# Patient Record
Sex: Male | Born: 1965 | Race: White | Hispanic: No | Smoking: Current every day smoker
Health system: Southern US, Community
[De-identification: ages and names within clinical notes are randomized; demographics above are authoritative.]

## PROBLEM LIST (undated history)

## (undated) DIAGNOSIS — J45909 Unspecified asthma, uncomplicated: Secondary | ICD-10-CM

## (undated) DIAGNOSIS — J449 Chronic obstructive pulmonary disease, unspecified: Secondary | ICD-10-CM

## (undated) DIAGNOSIS — J40 Bronchitis, not specified as acute or chronic: Secondary | ICD-10-CM

## (undated) DIAGNOSIS — M549 Dorsalgia, unspecified: Secondary | ICD-10-CM

## (undated) DIAGNOSIS — C801 Malignant (primary) neoplasm, unspecified: Secondary | ICD-10-CM

## (undated) DIAGNOSIS — J439 Emphysema, unspecified: Secondary | ICD-10-CM

---

## 1997-10-07 ENCOUNTER — Emergency Department (HOSPITAL_COMMUNITY): Admission: EM | Admit: 1997-10-07 | Discharge: 1997-10-07 | Payer: Self-pay | Admitting: Emergency Medicine

## 1997-10-07 ENCOUNTER — Encounter: Payer: Self-pay | Admitting: Emergency Medicine

## 1999-12-15 ENCOUNTER — Emergency Department (HOSPITAL_COMMUNITY): Admission: EM | Admit: 1999-12-15 | Discharge: 1999-12-15 | Payer: Self-pay | Admitting: Emergency Medicine

## 2004-10-10 ENCOUNTER — Emergency Department (HOSPITAL_COMMUNITY): Admission: EM | Admit: 2004-10-10 | Discharge: 2004-10-10 | Payer: Self-pay | Admitting: Emergency Medicine

## 2006-04-18 ENCOUNTER — Emergency Department (HOSPITAL_COMMUNITY): Admission: EM | Admit: 2006-04-18 | Discharge: 2006-04-18 | Payer: Self-pay | Admitting: Emergency Medicine

## 2007-09-28 ENCOUNTER — Emergency Department (HOSPITAL_COMMUNITY): Admission: EM | Admit: 2007-09-28 | Discharge: 2007-09-28 | Payer: Self-pay | Admitting: Emergency Medicine

## 2007-09-30 ENCOUNTER — Emergency Department (HOSPITAL_COMMUNITY): Admission: EM | Admit: 2007-09-30 | Discharge: 2007-09-30 | Payer: Self-pay | Admitting: Emergency Medicine

## 2010-01-19 ENCOUNTER — Emergency Department (HOSPITAL_COMMUNITY)
Admission: EM | Admit: 2010-01-19 | Discharge: 2010-01-19 | Payer: Self-pay | Source: Home / Self Care | Admitting: Emergency Medicine

## 2010-05-04 ENCOUNTER — Emergency Department (HOSPITAL_COMMUNITY)
Admission: EM | Admit: 2010-05-04 | Discharge: 2010-05-04 | Disposition: A | Discharge status: Home / Self Care | Payer: Self-pay | Attending: Emergency Medicine | Admitting: Emergency Medicine

## 2010-05-04 DIAGNOSIS — R209 Unspecified disturbances of skin sensation: Secondary | ICD-10-CM | POA: Insufficient documentation

## 2010-05-04 DIAGNOSIS — M79609 Pain in unspecified limb: Secondary | ICD-10-CM | POA: Insufficient documentation

## 2010-05-04 DIAGNOSIS — IMO0002 Reserved for concepts with insufficient information to code with codable children: Secondary | ICD-10-CM | POA: Insufficient documentation

## 2010-05-04 DIAGNOSIS — R3915 Urgency of urination: Secondary | ICD-10-CM | POA: Insufficient documentation

## 2010-05-04 DIAGNOSIS — K921 Melena: Secondary | ICD-10-CM | POA: Insufficient documentation

## 2010-05-04 DIAGNOSIS — M25559 Pain in unspecified hip: Secondary | ICD-10-CM | POA: Insufficient documentation

## 2010-05-04 DIAGNOSIS — R350 Frequency of micturition: Secondary | ICD-10-CM | POA: Insufficient documentation

## 2010-05-04 DIAGNOSIS — K625 Hemorrhage of anus and rectum: Secondary | ICD-10-CM | POA: Insufficient documentation

## 2010-05-04 DIAGNOSIS — K5900 Constipation, unspecified: Secondary | ICD-10-CM | POA: Insufficient documentation

## 2010-05-04 DIAGNOSIS — J45909 Unspecified asthma, uncomplicated: Secondary | ICD-10-CM | POA: Insufficient documentation

## 2010-05-04 LAB — DIFFERENTIAL
Basophils Absolute: 0.1 10*3/uL (ref 0.0–0.1)
Basophils Relative: 1 % (ref 0–1)
Eosinophils Absolute: 0.2 10*3/uL (ref 0.0–0.7)
Eosinophils Relative: 3 % (ref 0–5)
Lymphocytes Relative: 34 % (ref 12–46)
Lymphs Abs: 2.7 10*3/uL (ref 0.7–4.0)
Monocytes Absolute: 0.6 10*3/uL (ref 0.1–1.0)
Monocytes Relative: 8 % (ref 3–12)
Neutro Abs: 4.3 10*3/uL (ref 1.7–7.7)
Neutrophils Relative %: 55 % (ref 43–77)

## 2010-05-04 LAB — COMPREHENSIVE METABOLIC PANEL WITH GFR
ALT: 21 U/L (ref 0–53)
Alkaline Phosphatase: 49 U/L (ref 39–117)
BUN: 9 mg/dL (ref 6–23)
CO2: 26 meq/L (ref 19–32)
Chloride: 105 meq/L (ref 96–112)
Glucose, Bld: 86 mg/dL (ref 70–99)
Potassium: 3.8 meq/L (ref 3.5–5.1)
Sodium: 137 meq/L (ref 135–145)
Total Bilirubin: 0.4 mg/dL (ref 0.3–1.2)
Total Protein: 6.3 g/dL (ref 6.0–8.3)

## 2010-05-04 LAB — URINALYSIS, ROUTINE W REFLEX MICROSCOPIC
Bilirubin Urine: NEGATIVE
Glucose, UA: NEGATIVE mg/dL
Hgb urine dipstick: NEGATIVE
Ketones, ur: NEGATIVE mg/dL
Nitrite: NEGATIVE
Protein, ur: NEGATIVE mg/dL
Specific Gravity, Urine: 1.01 (ref 1.005–1.030)
Urobilinogen, UA: 0.2 mg/dL (ref 0.0–1.0)
pH: 7.5 (ref 5.0–8.0)

## 2010-05-04 LAB — CBC
HCT: 40.2 % (ref 39.0–52.0)
Hemoglobin: 13.7 g/dL (ref 13.0–17.0)
MCH: 30.3 pg (ref 26.0–34.0)
MCHC: 34.1 g/dL (ref 30.0–36.0)
MCV: 88.9 fL (ref 78.0–100.0)
Platelets: 264 10*3/uL (ref 150–400)
RBC: 4.52 MIL/uL (ref 4.22–5.81)
RDW: 13.4 % (ref 11.5–15.5)
WBC: 7.8 10*3/uL (ref 4.0–10.5)

## 2010-05-04 LAB — OCCULT BLOOD, POC DEVICE: Fecal Occult Bld: NEGATIVE

## 2010-05-04 LAB — COMPREHENSIVE METABOLIC PANEL
AST: 19 U/L (ref 0–37)
Albumin: 3.8 g/dL (ref 3.5–5.2)
Calcium: 8.9 mg/dL (ref 8.4–10.5)
Creatinine, Ser: 0.85 mg/dL (ref 0.4–1.5)
GFR calc Af Amer: >60 mL/min (ref >60)
GFR calc non Af Amer: >60 mL/min (ref >60)

## 2010-05-04 LAB — URINE MICROSCOPIC-ADD ON

## 2010-05-05 LAB — URINE CULTURE
Colony Count: 2000
Culture  Setup Time: 201204302157

## 2010-10-05 LAB — CULTURE, ROUTINE-ABSCESS

## 2012-01-25 ENCOUNTER — Emergency Department (HOSPITAL_COMMUNITY)
Admission: EM | Admit: 2012-01-25 | Discharge: 2012-01-25 | Disposition: A | Discharge status: Home / Self Care | Payer: Self-pay | Attending: Emergency Medicine | Admitting: Emergency Medicine

## 2012-01-25 ENCOUNTER — Encounter (HOSPITAL_COMMUNITY): Payer: Self-pay | Admitting: Emergency Medicine

## 2012-01-25 ENCOUNTER — Emergency Department (HOSPITAL_COMMUNITY): Payer: Self-pay

## 2012-01-25 DIAGNOSIS — M545 Low back pain, unspecified: Secondary | ICD-10-CM | POA: Insufficient documentation

## 2012-01-25 DIAGNOSIS — R350 Frequency of micturition: Secondary | ICD-10-CM | POA: Insufficient documentation

## 2012-01-25 DIAGNOSIS — Z711 Person with feared health complaint in whom no diagnosis is made: Secondary | ICD-10-CM

## 2012-01-25 DIAGNOSIS — Z8739 Personal history of other diseases of the musculoskeletal system and connective tissue: Secondary | ICD-10-CM | POA: Insufficient documentation

## 2012-01-25 DIAGNOSIS — M549 Dorsalgia, unspecified: Secondary | ICD-10-CM

## 2012-01-25 DIAGNOSIS — F17200 Nicotine dependence, unspecified, uncomplicated: Secondary | ICD-10-CM | POA: Insufficient documentation

## 2012-01-25 DIAGNOSIS — M25559 Pain in unspecified hip: Secondary | ICD-10-CM | POA: Insufficient documentation

## 2012-01-25 DIAGNOSIS — R209 Unspecified disturbances of skin sensation: Secondary | ICD-10-CM | POA: Insufficient documentation

## 2012-01-25 DIAGNOSIS — Z79899 Other long term (current) drug therapy: Secondary | ICD-10-CM | POA: Insufficient documentation

## 2012-01-25 HISTORY — DX: Dorsalgia, unspecified: M54.9

## 2012-01-25 LAB — URINALYSIS, ROUTINE W REFLEX MICROSCOPIC
Glucose, UA: NEGATIVE mg/dL
Leukocytes, UA: NEGATIVE
Nitrite: NEGATIVE
Protein, ur: NEGATIVE mg/dL
Urobilinogen, UA: 1 mg/dL (ref 0.0–1.0)
pH: 7 (ref 5.0–8.0)

## 2012-01-25 LAB — POCT I-STAT, CHEM 8
HCT: 48 % (ref 39.0–52.0)
Hemoglobin: 16.3 g/dL (ref 13.0–17.0)
Potassium: 4.3 mEq/L (ref 3.5–5.1)
Sodium: 139 mEq/L (ref 135–145)
TCO2: 29 mmol/L (ref 0–100)

## 2012-01-25 LAB — RPR: RPR Ser Ql: NONREACTIVE

## 2012-01-25 MED ORDER — HYDROCODONE-ACETAMINOPHEN 5-325 MG PO TABS
1.0000 | ORAL_TABLET | Freq: Once | ORAL | Status: AC
  Administered 2012-01-25: 1 via ORAL
  Filled 2012-01-25: qty 1

## 2012-01-25 MED ORDER — PERCOCET 5-325 MG PO TABS: 1.0000 | ORAL_TABLET | Freq: Four times a day (QID) | ORAL | Status: DC | PRN

## 2012-01-25 MED ORDER — DIAZEPAM 5 MG PO TABS
10.0000 mg | ORAL_TABLET | Freq: Once | ORAL | Status: AC
  Administered 2012-01-25: 10 mg via ORAL
  Filled 2012-01-25: qty 2

## 2012-01-25 MED ORDER — IBUPROFEN 800 MG PO TABS: 800.0000 mg | ORAL_TABLET | Freq: Three times a day (TID) | ORAL | Status: DC | PRN

## 2012-01-25 MED ORDER — ALBUTEROL SULFATE HFA 108 (90 BASE) MCG/ACT IN AERS
INHALATION_SPRAY | RESPIRATORY_TRACT | Status: AC
  Administered 2012-01-25: 2 via RESPIRATORY_TRACT
  Filled 2012-01-25: qty 6.7

## 2012-01-25 MED ORDER — DIAZEPAM 5 MG PO TABS: 5.0000 mg | ORAL_TABLET | Freq: Three times a day (TID) | ORAL | Status: DC | PRN

## 2012-01-25 MED ORDER — ALBUTEROL SULFATE HFA 108 (90 BASE) MCG/ACT IN AERS
2.0000 | INHALATION_SPRAY | Freq: Once | RESPIRATORY_TRACT | Status: AC
  Administered 2012-01-25: 2 via RESPIRATORY_TRACT

## 2012-01-25 NOTE — ED Provider Notes (Signed)
Medical screening examination/treatment/procedure(s) were performed by non-physician practitioner and as supervising physician I was immediately available for consultation/collaboration.   Joya Gaskins, MD 01/25/12 [phone]

## 2012-01-25 NOTE — ED Notes (Signed)
Last 3 months left leg numb from hip to toe constant pain stopped taking his gabapentin, no new injury and having some trouble w/ asthma

## 2012-01-25 NOTE — ED Notes (Signed)
Also wanted to know if untx std would cause numbness

## 2012-01-25 NOTE — ED Notes (Signed)
Pt for discharge.GCS 15.Vital signs stable .

## 2012-01-25 NOTE — ED Provider Notes (Signed)
History     CSN: [id number]  Arrival date & time 01/25/12  1025   First MD Initiated Contact with Patient 01/25/12 1053      Chief Complaint  Patient presents with  . Hip Pain    (Consider location/radiation/quality/duration/timing/severity/associated sxs/prior treatment) HPI Comments: Andres Harding is a 47 y.o. male with a history of chronic back pain presents emergency department with multiple complaints.  Primarily patient reports lower back pain and left hip pain.  Onset of symptoms began about 5 years ago and pain is typically intermittent lasting about one month long, however after twisting in a funny position back in May patient reports his pain was reproduced and it has been constant ever sense.  Associated symptoms include pain radiating from his left lumbar region to left hip and down to toe.  Patient reports numbness and tingling in a history of sciatica.  Patient used to take gabapentin however he has stopped taking her medication.  In addition patient states concern for possible STD reporting that he has intermittent penile discharge and increased urinary frequency.  Patient has no other complaints at this time.  The history is provided by the patient.    Past Medical History  Diagnosis Date  . Back pain     No past surgical history on file.  No family history on file.  History  Substance Use Topics  . Smoking status: Current Every Day Smoker  . Smokeless tobacco: Not on file  . Alcohol Use: Yes      Review of Systems  Constitutional: Negative for fever, diaphoresis and activity change.  HENT: Negative for congestion and neck pain.   Respiratory: Negative for cough.   Genitourinary: Positive for frequency and discharge. Negative for dysuria, hematuria, flank pain, penile swelling, scrotal swelling, genital sores, penile pain and testicular pain.  Musculoskeletal: Positive for back pain and arthralgias. Negative for myalgias and gait problem.  Skin:  Negative for color change and wound.  Neurological: Negative for headaches.  All other systems reviewed and are negative.    Allergies  Review of patient's allergies indicates no known allergies.  Home Medications   Current Outpatient Rx  Name  Route  Sig  Dispense  Refill  . ACETAMINOPHEN 500 MG PO TABS   Oral   Take 500 mg by mouth every 6 (six) hours as needed. For pain         . ALBUTEROL SULFATE (2.5 MG/3ML) 0.083% IN NEBU   Nebulization   Take 2.5 mg by nebulization daily as needed. For shortness of breath         . BECLOMETHASONE DIPROPIONATE 40 MCG/ACT IN AERS   Inhalation   Inhale 2 puffs into the lungs 2 (two) times daily.         . IBUPROFEN 200 MG PO TABS   Oral   Take 800 mg by mouth every 8 (eight) hours as needed. For pain           BP 131/80  Pulse 75  Temp 97.8 F (36.6 C)  Resp 16  SpO2 99%  Physical Exam  Nursing note and vitals reviewed. Constitutional: He appears well-developed and well-nourished. No distress.  HENT:  Head: Normocephalic and atraumatic.  Eyes: Conjunctivae normal and EOM are normal.  Neck: Normal range of motion. Neck supple.  Cardiovascular:       Intact distal pulses, capillary refill < 3 seconds  Abdominal:       Non pulsatile abd aorta  Genitourinary:  Exam chaperoned. Normal circumcised penis without dc or sores.   Musculoskeletal:       Left leg: Pain relieved with inversion, numbness induced w eversion of hip. ttp in left para lumbar region. No bony tenderness.  All other extremities with normal ROM  Neurological:       Strength 5/5 bilaterally. No sensory deficit. Good patellar reflexes.   Skin: He is not diaphoretic.       Skin intact, no tenting    ED Course  Procedures (including critical care time)   Labs Reviewed  URINALYSIS, ROUTINE W REFLEX MICROSCOPIC  POCT I-STAT, CHEM 8  RPR  GC/CHLAMYDIA PROBE AMP  HIV ANTIBODY (ROUTINE TESTING)   Dg Hip Complete Left  01/25/2012  *RADIOLOGY  REPORT*  Clinical Data: Chronic hip pain  LEFT HIP - COMPLETE 2+ VIEW  Comparison: None  Findings: Normal alignment and no fracture.  No significant joint space narrowing or spurring.  Negative for AVN.  IMPRESSION: Negative   Original Report Authenticated By: Janeece Riggers, M.D.      No diagnosis found.    MDM  Pt to ER w multiple complaints. STD test pending. Acute on chronic hip/back pain was treated in ER adn will dc pain meds. No focal neuro deficits on exam.  At this time there does not appear to be any evidence of an acute emergency medical condition and the patient appears stable for discharge with appropriate outpatient follow up.Diagnosis was discussed with patient who verbalizes understanding and is agreeable to discharge.          Jaci Carrel, [place] 01/25/12 1405

## 2012-01-26 LAB — HIV ANTIBODY (ROUTINE TESTING W REFLEX): HIV: NONREACTIVE

## 2012-07-03 ENCOUNTER — Encounter (HOSPITAL_COMMUNITY): Payer: Self-pay | Admitting: *Deleted

## 2012-07-03 ENCOUNTER — Emergency Department (HOSPITAL_COMMUNITY)
Admission: EM | Admit: 2012-07-03 | Discharge: 2012-07-03 | Disposition: A | Discharge status: Home / Self Care | Payer: Self-pay | Attending: Emergency Medicine | Admitting: Emergency Medicine

## 2012-07-03 DIAGNOSIS — J45909 Unspecified asthma, uncomplicated: Secondary | ICD-10-CM | POA: Insufficient documentation

## 2012-07-03 DIAGNOSIS — F17200 Nicotine dependence, unspecified, uncomplicated: Secondary | ICD-10-CM | POA: Insufficient documentation

## 2012-07-03 DIAGNOSIS — Y929 Unspecified place or not applicable: Secondary | ICD-10-CM | POA: Insufficient documentation

## 2012-07-03 DIAGNOSIS — IMO0002 Reserved for concepts with insufficient information to code with codable children: Secondary | ICD-10-CM | POA: Insufficient documentation

## 2012-07-03 DIAGNOSIS — Y9389 Activity, other specified: Secondary | ICD-10-CM | POA: Insufficient documentation

## 2012-07-03 DIAGNOSIS — W278XXA Contact with other nonpowered hand tool, initial encounter: Secondary | ICD-10-CM | POA: Insufficient documentation

## 2012-07-03 DIAGNOSIS — Z23 Encounter for immunization: Secondary | ICD-10-CM | POA: Insufficient documentation

## 2012-07-03 DIAGNOSIS — S61509A Unspecified open wound of unspecified wrist, initial encounter: Secondary | ICD-10-CM | POA: Insufficient documentation

## 2012-07-03 DIAGNOSIS — Z79899 Other long term (current) drug therapy: Secondary | ICD-10-CM | POA: Insufficient documentation

## 2012-07-03 HISTORY — DX: Unspecified asthma, uncomplicated: J45.909

## 2012-07-03 MED ORDER — TETANUS-DIPHTH-ACELL PERTUSSIS 5-2.5-18.5 LF-MCG/0.5 IM SUSP
0.5000 mL | Freq: Once | INTRAMUSCULAR | Status: AC
  Administered 2012-07-03: 0.5 mL via INTRAMUSCULAR
  Filled 2012-07-03: qty 0.5

## 2012-07-03 NOTE — ED Provider Notes (Signed)
   History    CSN: [id number] Arrival date & time 07/03/12  0807  First MD Initiated Contact with Patient 07/03/12 0809     Chief Complaint  Patient presents with  . Laceration   (Consider location/radiation/quality/duration/timing/severity/associated sxs/prior Treatment) HPI   SUBJECTIVE:  47 y.o. male sustained laceration of left wrist greater than 12 hours ago. Nature of injury: Patient sliced wrist on metal outcrop while coming down latter. Tetanus vaccination status reviewed: tetanus status unknown to the patient.  Patient denies any paresthesia, radiation of pain.  Describes it as achy.  Denies heat redness or swelling or drainage. Denies fevers, chills, myalgias, arthralgias.  No loss of grip strength. Denies easy bleeding or bruising.      Past Medical History  Diagnosis Date  . Back pain   . Asthma    History reviewed. No pertinent past surgical history. No family history on file. History  Substance Use Topics  . Smoking status: Current Every Day Smoker  . Smokeless tobacco: Not on file  . Alcohol Use: Yes     Comment: occasional    Review of Systems Ten systems reviewed and are negative for acute change, except as noted in the HPI.   Allergies  Review of patient's allergies indicates no known allergies.  Home Medications   Current Outpatient Rx  Name  Route  Sig  Dispense  Refill  . acetaminophen (TYLENOL) 500 MG tablet   Oral   Take 500 mg by mouth every 6 (six) hours as needed. For pain         . albuterol (PROVENTIL) (2.5 MG/3ML) 0.083% nebulizer solution   Nebulization   Take 2.5 mg by nebulization daily as needed. For shortness of breath         . beclomethasone (QVAR) 40 MCG/ACT inhaler   Inhalation   Inhale 2 puffs into the lungs 2 (two) times daily.          BP 112/79  Pulse 75  Temp(Src) 97.6 F (36.4 C) (Oral)  Resp 20  Ht 6\' 7"  (2.007 m)  Wt 180 lb (81.647 kg)  BMI 20.27 kg/m2  SpO2 98% Physical Exam Physical Exam   Nursing note and vitals reviewed. Constitutional: He appears well-developed and well-nourished. No distress.  HENT:  Head: Normocephalic and atraumatic.  Eyes: Conjunctivae normal are normal. No scleral icterus.  Neck: Normal range of motion. Neck supple.  Cardiovascular: Normal rate, regular rhythm and normal heart sounds.   Pulmonary/Chest: Effort normal and breath sounds normal. No respiratory distress.  Abdominal: Soft. There is no tenderness.  Musculoskeletal: He exhibits no edema. FROM Left fingers, hand, and wrist. Normal pulses and strength. Neurological: He is alert.  Skin: Skin is warm and dry. He is not diaphoretic. 3 cm superficial laceration without subcutaneous or tendonous involvement. No signs of infection. Wound appears clean. Psychiatric: His behavior is normal.     ED Course  Procedures (including critical care time) Labs Reviewed - No data to display No results found. 1. Laceration     MDM  **Patient with supercficial laceration. TDAP updated. No signs of infection. I am not closing the lac due to length of time since injury. Care instructions given. Sterile bandage applied.  Return precautions discussed.  Arthor Captain, PA-C 07/03/12 [phone]

## 2012-07-03 NOTE — ED Provider Notes (Signed)
Medical screening examination/treatment/procedure(s) were performed by non-physician practitioner and as supervising physician I was immediately available for consultation/collaboration.   Suzi Roots, MD 07/03/12 [phone]

## 2012-07-03 NOTE — ED Notes (Signed)
Reports cut wrist on sheet metal last night 2000, was able to control bleeding at home. States cleaned with peroxide & neosporin oint applied. No active bleeding presently. CSM intact.

## 2012-07-03 NOTE — ED Notes (Signed)
C/o lac to left wrist area & pain to forearm area. No active bleeding presently

## 2012-10-20 ENCOUNTER — Encounter (HOSPITAL_COMMUNITY): Payer: Self-pay | Admitting: Emergency Medicine

## 2012-10-20 ENCOUNTER — Emergency Department (HOSPITAL_COMMUNITY)

## 2012-10-20 ENCOUNTER — Emergency Department (HOSPITAL_COMMUNITY)
Admission: EM | Admit: 2012-10-20 | Discharge: 2012-10-20 | Discharge status: Against Medical Advice | Attending: Emergency Medicine | Admitting: Emergency Medicine

## 2012-10-20 DIAGNOSIS — IMO0002 Reserved for concepts with insufficient information to code with codable children: Secondary | ICD-10-CM | POA: Insufficient documentation

## 2012-10-20 DIAGNOSIS — R079 Chest pain, unspecified: Secondary | ICD-10-CM

## 2012-10-20 DIAGNOSIS — Z79899 Other long term (current) drug therapy: Secondary | ICD-10-CM | POA: Insufficient documentation

## 2012-10-20 DIAGNOSIS — Z8739 Personal history of other diseases of the musculoskeletal system and connective tissue: Secondary | ICD-10-CM | POA: Insufficient documentation

## 2012-10-20 DIAGNOSIS — R071 Chest pain on breathing: Secondary | ICD-10-CM | POA: Insufficient documentation

## 2012-10-20 DIAGNOSIS — J3489 Other specified disorders of nose and nasal sinuses: Secondary | ICD-10-CM | POA: Insufficient documentation

## 2012-10-20 DIAGNOSIS — F17200 Nicotine dependence, unspecified, uncomplicated: Secondary | ICD-10-CM | POA: Insufficient documentation

## 2012-10-20 DIAGNOSIS — R197 Diarrhea, unspecified: Secondary | ICD-10-CM | POA: Insufficient documentation

## 2012-10-20 DIAGNOSIS — J45901 Unspecified asthma with (acute) exacerbation: Secondary | ICD-10-CM | POA: Insufficient documentation

## 2012-10-20 LAB — BASIC METABOLIC PANEL
BUN: 9 mg/dL (ref 6–23)
Chloride: 104 mEq/L (ref 96–112)
Creatinine, Ser: 0.9 mg/dL (ref 0.50–1.35)
GFR calc Af Amer: >90 mL/min (ref >90)
GFR calc non Af Amer: >90 mL/min (ref >90)
Glucose, Bld: 83 mg/dL (ref 70–99)
Potassium: 3.7 mEq/L (ref 3.5–5.1)
Sodium: 140 mEq/L (ref 135–145)

## 2012-10-20 LAB — CBC
HCT: 40.9 % (ref 39.0–52.0)
MCHC: 34 g/dL (ref 30.0–36.0)
MCV: 91.3 fL (ref 78.0–100.0)
RBC: 4.48 MIL/uL (ref 4.22–5.81)
RDW: 13.7 % (ref 11.5–15.5)
WBC: 7.4 10*3/uL (ref 4.0–10.5)

## 2012-10-20 LAB — TROPONIN I: Troponin I: <0.3 ng/mL (ref <0.30)

## 2012-10-20 LAB — D-DIMER, QUANTITATIVE: D-Dimer, Quant: <0.27 ug/mL-FEU (ref 0.00–0.48)

## 2012-10-20 NOTE — ED Provider Notes (Signed)
CSN: [id number]     Arrival date & time 10/20/12  1239 History   First MD Initiated Contact with Patient 10/20/12 1244     Chief Complaint  Patient presents with  . Chest Pain    HPI  Andres Harding is a 47 y.o. male with a PMH of back pain and asthma who presents to the ED for evaluation of chest pain.  History was provided by the patient.  Patient states he's been having chest pain for the past 2 weeks. His pain has been getting progressively worse. His pain is located on the right chest wall without radiation. His pain is worse with deep breathing, movement and coughing. He states that gentle massage and rest improve his pain.  Patient has been doing albuterol treatments because he feels like he cannot take a deep breath due to pain. He did three albuterol treatments prior to arrival. He states he's been coughing with green productive sputum. No hemoptysis. No fever. Patient describes his pain as a 1/10 discomfort and a deep pressure with aggravating factors.  Patient states he had similar chest pain about 3 or 4 years ago. He was admitted due to a lung mass found on his x-ray. He had an extensive workup and was discharged. He states he had a followup x-ray in the lung mass was cleared. He denies any history of cardiac problems in the past. He does not have a strong family history of cardiac disease. He is currently smoking. No cocaine use. Is not a primary care provider. He's otherwise been well with no abdominal pain, nausea, vomiting. He states he had a few episodes of diarrhea throughout the week which have resolved. He also has rhinorrhea. No hx of clotting, recent travel, cancer, surgery, or immobilization.        Past Medical History  Diagnosis Date  . Back pain   . Asthma    History reviewed. No pertinent past surgical history. History reviewed. No pertinent family history. History  Substance Use Topics  . Smoking status: Current Every Day Smoker  . Smokeless tobacco: Not on  file  . Alcohol Use: Yes     Comment: occasional    Review of Systems  Constitutional: Negative for fever, chills, diaphoresis, activity change, appetite change and fatigue.  HENT: Positive for rhinorrhea. Negative for congestion, ear pain, facial swelling and sore throat.   Eyes: Negative for visual disturbance.  Respiratory: Positive for cough, shortness of breath and wheezing.   Cardiovascular: Positive for chest pain. Negative for leg swelling.  Gastrointestinal: Positive for diarrhea. Negative for nausea, vomiting, abdominal pain and constipation.  Genitourinary: Negative for dysuria.  Musculoskeletal: Negative for back pain.  Skin: Negative for color change and wound.  Neurological: Negative for dizziness, syncope, weakness, light-headedness, numbness and headaches.    Allergies  Review of patient's allergies indicates no known allergies.  Home Medications   Current Outpatient Rx  Name  Route  Sig  Dispense  Refill  . acetaminophen (TYLENOL) 500 MG tablet   Oral   Take 500 mg by mouth every 6 (six) hours as needed. For pain         . albuterol (PROVENTIL HFA;VENTOLIN HFA) 108 (90 BASE) MCG/ACT inhaler   Inhalation   Inhale 2 puffs into the lungs every 6 (six) hours as needed for wheezing.         Marland Kitchen albuterol (PROVENTIL) (2.5 MG/3ML) 0.083% nebulizer solution   Nebulization   Take 2.5 mg by nebulization daily as needed. For  shortness of breath         . beclomethasone (QVAR) 40 MCG/ACT inhaler   Inhalation   Inhale 2 puffs into the lungs 2 (two) times daily.          BP 126/71  Pulse 67  Temp(Src) 98.1 F (36.7 C) (Oral)  Resp 18  Ht 6\' 7"  (2.007 m)  Wt 180 lb (81.647 kg)  BMI 20.27 kg/m2  SpO2 98%  Filed Vitals:   10/20/12 1242 10/20/12 1250 10/20/12 1522 10/20/12 1715  BP: 119/80 126/71 136/85 118/83  Pulse: 77 67 63 71  Temp: 98.1 F (36.7 C)     TempSrc: Oral     Resp: 22 18 18 20   Height: 6\' 7"  (2.007 m)     Weight: 180 lb (81.647 kg)      SpO2: 99% 98% 97% 98%    Physical Exam  Nursing note and vitals reviewed. Constitutional: He is oriented to person, place, and time. He appears well-developed and well-nourished. No distress.  HENT:  Head: Normocephalic and atraumatic.  Right Ear: External ear normal.  Left Ear: External ear normal.  Nose: Nose normal.  Mouth/Throat: Oropharynx is clear and moist. No oropharyngeal exudate.  Eyes: Conjunctivae are normal. Pupils are equal, round, and reactive to light. Right eye exhibits no discharge. Left eye exhibits no discharge.  Neck: Normal range of motion. Neck supple.  Cardiovascular: Normal rate, regular rhythm, normal heart sounds and intact distal pulses.  Exam reveals no gallop and no friction rub.   No murmur heard. Pulmonary/Chest: Effort normal and breath sounds normal. No respiratory distress. He has no wheezes. He has no rales. He exhibits tenderness.    Decreased breath sounds throughout. Focal tenderness to palpation to the right chest.   Abdominal: Soft. He exhibits no distension. There is no tenderness.  Musculoskeletal: Normal range of motion. He exhibits no edema and no tenderness.  Neurological: He is alert and oriented to person, place, and time.  Skin: Skin is warm and dry. He is not diaphoretic.    ED Course  Procedures (including critical care time) Labs Review Labs Reviewed - No data to display Imaging Review No results found.  EKG Interpretation     Ventricular Rate:  67 PR Interval:  114 QRS Duration: 96 QT Interval:  392 QTC Calculation: 414 R Axis:   89 Text Interpretation:  Normal sinus rhythm Normal ECG No previous ECGs available            DG Chest 2 View (Final result)  Result time: 10/20/12 14:12:23    Final result by Rad Results In Interface (10/20/12 14:12:23)    Narrative:   CLINICAL DATA: Left-sided chest pain.  EXAM: CHEST 2 VIEW  COMPARISON: None  FINDINGS: Normal cardiac and mediastinal contours. No large  consolidative pulmonary opacities. Bilateral mid lung nodular opacities. No pleural effusion pneumothorax. Regional skeleton is unremarkable. Marland Kitchen  IMPRESSION: 1. No acute cardiopulmonary process. 2. Bilateral mid lung nodular opacities may represent nipple shadows. Repeat chest radiograph with nipple markers is advised.   Electronically Signed By: Annia Belt M.D. On: 10/20/2012 14:12    Results for orders placed during the hospital encounter of 10/20/12  D-DIMER, QUANTITATIVE      Result Value Range   D-Dimer, Quant <0.27  0.00 - 0.48 ug/mL-FEU  TROPONIN I      Result Value Range   Troponin I <0.30  <0.30 ng/mL  CBC      Result Value Range   WBC 7.4  4.0 -  10.5 K/uL   RBC 4.48  4.22 - 5.81 MIL/uL   Hemoglobin 13.9  13.0 - 17.0 g/dL   HCT 64.4  03.4 - 74.2 %   MCV 91.3  78.0 - 100.0 fL   MCH 31.0  26.0 - 34.0 pg   MCHC 34.0  30.0 - 36.0 g/dL   RDW 59.5  63.8 - 75.6 %   Platelets 227  150 - 400 K/uL  BASIC METABOLIC PANEL      Result Value Range   Sodium 140  135 - 145 mEq/L   Potassium 3.7  3.5 - 5.1 mEq/L   Chloride 104  96 - 112 mEq/L   CO2 26  19 - 32 mEq/L   Glucose, Bld 83  70 - 99 mg/dL   BUN 9  6 - 23 mg/dL   Creatinine, Ser 4.33  0.50 - 1.35 mg/dL   Calcium 8.5  8.4 - 29.5 mg/dL   GFR calc non Af Amer >90  >90 mL/min   GFR calc Af Amer >90  >90 mL/min     MDM   1. Chest pain     Andres Harding is a 47 y.o. male with a PMH of back pain and asthma who presents to the ED for evaluation of chest pain. Chest x-ray, troponin, CBC, BMP, and d-dimer ordered to further evaluate.     Rechecks  3:45 PM = Patient refusing labs.  He states "I have been waiting here for hours and no one has drawn my blood."  "I have to be somewhere at 5:00 pm and I am leaving at 4:55 PM no matter what your results show.  Patient consents to lab work but is leaving at 4:55 PM "regardless of the results."        4:00 PM = signing out care to [employer] PA-C who will await  labs.    Luiz Iron PA-C   This patient was discussed with Dr. Candise Bowens, PA-C 10/22/12 1949

## 2012-10-20 NOTE — ED Notes (Signed)
Pt c/o right sided CP worse with cough and inspiration x 2 weeks

## 2012-10-20 NOTE — ED Provider Notes (Signed)
Patient signed out to me by Inda Coke, PA-C at shift change.  Labs pending.  Patient presents with a chief complaint of chest pain that has been present for the past 2 weeks.  Chest tender to palpation.  No ischemic changes on EKG.  Chest xray show possible lung nodules, which the patient has been informed of by previous PA.  Plan is for the patient to be discharged if the labs are unremarkable.  5:30 PM Patient left AMA without notifying staff.  Troponin and D-dimer negative.  BMP unremarkable.  CBC was pending.    Pascal Lux [place], PA-C 10/20/12 2200

## 2012-10-20 NOTE — Progress Notes (Signed)
ED CM noted patient to not have a PCP or health insurance .Pt presented to [facility] ED with [facility]. In to speak with patient, he reports she does not have a PCP or health insurance. CM discussed with patient on how to obtain an a PCP, provided verbal and written information about the [facility]. Pt gave permission to have information forwarded to the [facility] for f/u appointment. Verified current phone number in record. Also, discussed the importance and benefits of having a PCP, and not utilizing the ED for primary care needs. Pt was appreciative and is agreement with plan. Pt instructed to contact [facility] if not contacted within 3-5 working days. Pt verbalized understanding. No further CM needs identified.   

## 2012-10-20 NOTE — ED Notes (Signed)
Pt brought back to room from triage; pt undressed, in gown, on monitor, continuous pulse oximetry and blood pressure cuff

## 2012-10-21 NOTE — ED Provider Notes (Signed)
Medical screening examination/treatment/procedure(s) were performed by non-physician practitioner and as supervising physician I was immediately available for consultation/collaboration.  Anhelica Fowers M Brenleigh Collet, MD 10/21/12 0022 

## 2012-10-22 NOTE — ED Provider Notes (Signed)
Medical screening examination/treatment/procedure(s) were conducted as a shared visit with non-physician practitioner(s) and myself.  I personally evaluated the patient during the encounter  Intermittent and persistent R chest pain x 2 weeks, worse with coughing, deep breathing, movement. TTP R chest, CTAB, RRR.  EKG nsr.  Check D-dimer.  Atypical for ACS.     Glynn Octave, MD 10/22/12 [phone]

## 2012-10-25 ENCOUNTER — Telehealth: Payer: Self-pay | Admitting: General Practice

## 2012-10-25 NOTE — Telephone Encounter (Signed)
Pt referred by [facility].  Called pt to schedule appt but pt declined due to negative experience at ED.  Says he does not want to be a patient with any [facility] facility. Pt says he will independently search for a PCP with whom to follow-up.

## 2013-04-16 ENCOUNTER — Emergency Department (HOSPITAL_COMMUNITY)
Admission: EM | Admit: 2013-04-16 | Discharge: 2013-04-16 | Disposition: A | Discharge status: Home / Self Care | Payer: No Typology Code available for payment source | Attending: Emergency Medicine | Admitting: Emergency Medicine

## 2013-04-16 ENCOUNTER — Encounter (HOSPITAL_COMMUNITY): Payer: Self-pay | Admitting: Emergency Medicine

## 2013-04-16 DIAGNOSIS — J020 Streptococcal pharyngitis: Secondary | ICD-10-CM | POA: Insufficient documentation

## 2013-04-16 DIAGNOSIS — J45909 Unspecified asthma, uncomplicated: Secondary | ICD-10-CM | POA: Insufficient documentation

## 2013-04-16 DIAGNOSIS — F17200 Nicotine dependence, unspecified, uncomplicated: Secondary | ICD-10-CM | POA: Insufficient documentation

## 2013-04-16 DIAGNOSIS — Z79899 Other long term (current) drug therapy: Secondary | ICD-10-CM | POA: Insufficient documentation

## 2013-04-16 LAB — CBC WITH DIFFERENTIAL/PLATELET
Basophils Absolute: 0 10*3/uL (ref 0.0–0.1)
Basophils Relative: 0 % (ref 0–1)
EOS ABS: 0.1 10*3/uL (ref 0.0–0.7)
EOS PCT: 0 % (ref 0–5)
HEMATOCRIT: 45 % (ref 39.0–52.0)
Hemoglobin: 16.2 g/dL (ref 13.0–17.0)
Lymphocytes Relative: 8 % — ABNORMAL LOW (ref 12–46)
Lymphs Abs: 1.2 10*3/uL (ref 0.7–4.0)
MCH: 33.4 pg (ref 26.0–34.0)
MCHC: 36 g/dL (ref 30.0–36.0)
MCV: 92.8 fL (ref 78.0–100.0)
MONO ABS: 1.3 10*3/uL — AB (ref 0.1–1.0)
Monocytes Relative: 9 % (ref 3–12)
Neutro Abs: 12.2 10*3/uL — ABNORMAL HIGH (ref 1.7–7.7)
Neutrophils Relative %: 83 % — ABNORMAL HIGH (ref 43–77)
PLATELETS: 191 10*3/uL (ref 150–400)
RBC: 4.85 MIL/uL (ref 4.22–5.81)
RDW: 13.4 % (ref 11.5–15.5)
WBC: 14.7 10*3/uL — ABNORMAL HIGH (ref 4.0–10.5)

## 2013-04-16 LAB — BASIC METABOLIC PANEL
BUN: 10 mg/dL (ref 6–23)
CALCIUM: 8.7 mg/dL (ref 8.4–10.5)
CO2: 22 meq/L (ref 19–32)
CREATININE: 0.89 mg/dL (ref 0.50–1.35)
Chloride: 100 mEq/L (ref 96–112)
GFR calc Af Amer: >90 mL/min (ref >90)
Glucose, Bld: 132 mg/dL — ABNORMAL HIGH (ref 70–99)
Potassium: 3.8 mEq/L (ref 3.7–5.3)
SODIUM: 137 meq/L (ref 137–147)

## 2013-04-16 MED ORDER — ACETAMINOPHEN 500 MG PO TABS
1000.0000 mg | ORAL_TABLET | Freq: Once | ORAL | Status: AC
  Administered 2013-04-16: 1000 mg via ORAL
  Filled 2013-04-16: qty 2

## 2013-04-16 MED ORDER — PENICILLIN G BENZATHINE 1200000 UNIT/2ML IM SUSP
1.2000 10*6.[IU] | Freq: Once | INTRAMUSCULAR | Status: AC
  Administered 2013-04-16: 1.2 10*6.[IU] via INTRAMUSCULAR
  Filled 2013-04-16: qty 2

## 2013-04-16 MED ORDER — DEXAMETHASONE 6 MG PO TABS
6.0000 mg | ORAL_TABLET | Freq: Once | ORAL | Status: AC
  Administered 2013-04-16: 6 mg via ORAL
  Filled 2013-04-16: qty 1

## 2013-04-16 MED ORDER — SODIUM CHLORIDE 0.9 % IV BOLUS (SEPSIS)
1000.0000 mL | Freq: Once | INTRAVENOUS | Status: AC
  Administered 2013-04-16: 1000 mL via INTRAVENOUS

## 2013-04-16 NOTE — ED Notes (Addendum)
Per pt sts sore throat and throat very swollen. sts fever and some abdominal pain. sts also some SOB. sts his son recently dx with strep throat.

## 2013-04-16 NOTE — ED Provider Notes (Signed)
CSN: [id number]     Arrival date & time 04/16/13  1310 History   First MD Initiated Contact with Patient 04/16/13 1335     Chief Complaint  Patient presents with  . Sore Throat     (Consider location/radiation/quality/duration/timing/severity/associated sxs/prior Treatment) HPI Comments: Patient is a 48 year old male who presents with a 3 day history of sore throat. Patient reports gradual onset and progressively worsening sharp, severe throat pain. The pain is constant and made worse with swallowing. The pain is localized to the patient's throat and equal on both sides. Nothing alleviates the pain. The patient has not tried anything for symptom relief. Patient reports associated subjective fever, cervical adenopathy, and non productive cough. Patient denies headache, visual changes, sinus congestion, difficulty breathing, chest pain, SOB, abdominal pain, NVD.      Past Medical History  Diagnosis Date  . Back pain   . Asthma    History reviewed. No pertinent past surgical history. History reviewed. No pertinent family history. History  Substance Use Topics  . Smoking status: Current Every Day Smoker  . Smokeless tobacco: Not on file  . Alcohol Use: Yes     Comment: occasional    Review of Systems  Constitutional: Positive for fever. Negative for chills and fatigue.  HENT: Positive for sore throat. Negative for trouble swallowing.   Eyes: Negative for visual disturbance.  Respiratory: Negative for shortness of breath.   Cardiovascular: Negative for chest pain and palpitations.  Gastrointestinal: Negative for nausea, vomiting, abdominal pain and diarrhea.  Genitourinary: Negative for dysuria and difficulty urinating.  Musculoskeletal: Negative for arthralgias and neck pain.  Skin: Negative for color change.  Neurological: Negative for dizziness and weakness.  Psychiatric/Behavioral: Negative for dysphoric mood.      Allergies  Review of patient's allergies indicates no  known allergies.  Home Medications   Current Outpatient Rx  Name  Route  Sig  Dispense  Refill  . albuterol (PROVENTIL) (2.5 MG/3ML) 0.083% nebulizer solution   Nebulization   Take 2.5 mg by nebulization every 6 (six) hours as needed for wheezing or shortness of breath. For shortness of breath         . IBUPROFEN PO   Oral   Take 1 tablet by mouth every 6 (six) hours as needed (pain).         . Soft Lens Products (MULTI-PURPOSE SOLUTION) SOLN   Both Eyes   Place 1 drop into both eyes daily as needed (dry eyes).          BP 161/87  Pulse 115  Temp(Src) 99.2 F (37.3 C) (Oral)  Resp 18  SpO2 99% Physical Exam  Nursing note and vitals reviewed. Constitutional: He is oriented to person, place, and time. He appears well-developed and well-nourished. No distress.  HENT:  Head: Normocephalic and atraumatic.  Posterior pharynx erythema and exudate.   Eyes: Conjunctivae and EOM are normal.  Neck: Normal range of motion.  Cardiovascular: Normal rate and regular rhythm.  Exam reveals no gallop and no friction rub.   No murmur heard. Pulmonary/Chest: Effort normal and breath sounds normal. He has no wheezes. He has no rales. He exhibits no tenderness.  Abdominal: Soft. He exhibits no distension. There is no tenderness. There is no rebound and no guarding.  Musculoskeletal: Normal range of motion.  Neurological: He is alert and oriented to person, place, and time. Coordination normal.  Speech is goal-oriented. Moves limbs without ataxia.   Skin: Skin is warm and dry.  Psychiatric:  He has a normal mood and affect. His behavior is normal.    ED Course  Procedures (including critical care time) Labs Review Labs Reviewed  CBC WITH DIFFERENTIAL - Abnormal; Notable for the following:    WBC 14.7 (*)    Neutrophils Relative % 83 (*)    Neutro Abs 12.2 (*)    Lymphocytes Relative 8 (*)    Monocytes Absolute 1.3 (*)    All other components within normal limits  BASIC METABOLIC  PANEL - Abnormal; Notable for the following:    Glucose, Bld 132 (*)    All other components within normal limits   Imaging Review No results found.   EKG Interpretation None      MDM   Final diagnoses:  Strep pharyngitis    2:56 PM Patient has strep throat based on [facility] criteria. Patient will have IM pen G and be discharged. Patient has elevated WBC likely due to infection. Patient tachycardic which is likely due to low grade temp of 99. No further evaluation needed at this time.     Alvina Chou, PA-C 04/16/13 1525

## 2013-04-17 NOTE — ED Provider Notes (Signed)
Medical screening examination/treatment/procedure(s) were performed by non-physician practitioner and as supervising physician I was immediately available for consultation/collaboration.   EKG Interpretation None        Mirna Mires, MD 04/17/13 [phone]

## 2013-07-23 ENCOUNTER — Encounter (HOSPITAL_COMMUNITY): Payer: Self-pay | Admitting: Emergency Medicine

## 2013-07-23 ENCOUNTER — Emergency Department (HOSPITAL_COMMUNITY)
Admission: EM | Admit: 2013-07-23 | Discharge: 2013-07-23 | Disposition: A | Discharge status: Home / Self Care | Payer: No Typology Code available for payment source | Attending: Emergency Medicine | Admitting: Emergency Medicine

## 2013-07-23 DIAGNOSIS — J449 Chronic obstructive pulmonary disease, unspecified: Secondary | ICD-10-CM | POA: Insufficient documentation

## 2013-07-23 DIAGNOSIS — R259 Unspecified abnormal involuntary movements: Secondary | ICD-10-CM | POA: Insufficient documentation

## 2013-07-23 DIAGNOSIS — Z7982 Long term (current) use of aspirin: Secondary | ICD-10-CM | POA: Insufficient documentation

## 2013-07-23 DIAGNOSIS — R197 Diarrhea, unspecified: Secondary | ICD-10-CM | POA: Insufficient documentation

## 2013-07-23 DIAGNOSIS — J4489 Other specified chronic obstructive pulmonary disease: Secondary | ICD-10-CM | POA: Insufficient documentation

## 2013-07-23 DIAGNOSIS — R109 Unspecified abdominal pain: Secondary | ICD-10-CM | POA: Insufficient documentation

## 2013-07-23 DIAGNOSIS — Z79899 Other long term (current) drug therapy: Secondary | ICD-10-CM | POA: Insufficient documentation

## 2013-07-23 DIAGNOSIS — F17200 Nicotine dependence, unspecified, uncomplicated: Secondary | ICD-10-CM | POA: Insufficient documentation

## 2013-07-23 DIAGNOSIS — E860 Dehydration: Secondary | ICD-10-CM | POA: Insufficient documentation

## 2013-07-23 HISTORY — DX: Chronic obstructive pulmonary disease, unspecified: J44.9

## 2013-07-23 LAB — CBC WITH DIFFERENTIAL/PLATELET
Basophils Absolute: 0 10*3/uL (ref 0.0–0.1)
Basophils Relative: 0 % (ref 0–1)
Eosinophils Absolute: 0.4 10*3/uL (ref 0.0–0.7)
Eosinophils Relative: 3 % (ref 0–5)
HCT: 49 % (ref 39.0–52.0)
Hemoglobin: 16.3 g/dL (ref 13.0–17.0)
LYMPHS ABS: 0.8 10*3/uL (ref 0.7–4.0)
Lymphocytes Relative: 7 % — ABNORMAL LOW (ref 12–46)
MCH: 32.6 pg (ref 26.0–34.0)
MCHC: 33.3 g/dL (ref 30.0–36.0)
MCV: 98 fL (ref 78.0–100.0)
Monocytes Absolute: 0.4 10*3/uL (ref 0.1–1.0)
Monocytes Relative: 3 % (ref 3–12)
NEUTROS PCT: 87 % — AB (ref 43–77)
Neutro Abs: 10 10*3/uL — ABNORMAL HIGH (ref 1.7–7.7)
PLATELETS: 269 10*3/uL (ref 150–400)
RBC: 5 MIL/uL (ref 4.22–5.81)
RDW: 14 % (ref 11.5–15.5)
WBC: 11.6 10*3/uL — ABNORMAL HIGH (ref 4.0–10.5)

## 2013-07-23 LAB — COMPREHENSIVE METABOLIC PANEL
ALBUMIN: 3.9 g/dL (ref 3.5–5.2)
ALK PHOS: 81 U/L (ref 39–117)
ALT: 22 U/L (ref 0–53)
AST: 26 U/L (ref 0–37)
Anion gap: 16 — ABNORMAL HIGH (ref 5–15)
BUN: 13 mg/dL (ref 6–23)
CALCIUM: 9 mg/dL (ref 8.4–10.5)
CO2: 24 mEq/L (ref 19–32)
Chloride: 100 mEq/L (ref 96–112)
Creatinine, Ser: 0.91 mg/dL (ref 0.50–1.35)
GFR calc Af Amer: >90 mL/min (ref >90)
GFR calc non Af Amer: >90 mL/min (ref >90)
Glucose, Bld: 135 mg/dL — ABNORMAL HIGH (ref 70–99)
POTASSIUM: 4.2 meq/L (ref 3.7–5.3)
SODIUM: 140 meq/L (ref 137–147)
Total Bilirubin: 0.4 mg/dL (ref 0.3–1.2)
Total Protein: 7.7 g/dL (ref 6.0–8.3)

## 2013-07-23 LAB — LIPASE, BLOOD: Lipase: 25 U/L (ref 11–59)

## 2013-07-23 LAB — ETHANOL: Alcohol, Ethyl (B): <11 mg/dL (ref 0–11)

## 2013-07-23 MED ORDER — ONDANSETRON HCL 4 MG PO TABS
4.0000 mg | ORAL_TABLET | Freq: Four times a day (QID) | ORAL | Status: DC
Start: 2013-07-23 — End: 2013-09-23

## 2013-07-23 MED ORDER — GI COCKTAIL ~~LOC~~
30.0000 mL | Freq: Once | ORAL | Status: AC
Start: 2013-07-23 — End: 2013-07-23
  Administered 2013-07-23: 30 mL via ORAL
  Filled 2013-07-23: qty 30

## 2013-07-23 MED ORDER — FAMOTIDINE IN NACL 20-0.9 MG/50ML-% IV SOLN
20.0000 mg | Freq: Once | INTRAVENOUS | Status: AC
  Administered 2013-07-23: 20 mg via INTRAVENOUS
  Filled 2013-07-23: qty 50

## 2013-07-23 MED ORDER — LORAZEPAM 2 MG/ML IJ SOLN
0.5000 mg | Freq: Once | INTRAMUSCULAR | Status: AC
  Administered 2013-07-23: 0.5 mg via INTRAVENOUS
  Filled 2013-07-23: qty 1

## 2013-07-23 MED ORDER — OMEPRAZOLE 20 MG PO CPDR: 20.0000 mg | DELAYED_RELEASE_CAPSULE | Freq: Every day | ORAL | Status: DC

## 2013-07-23 MED ORDER — ONDANSETRON HCL 4 MG/2ML IJ SOLN
4.0000 mg | Freq: Once | INTRAMUSCULAR | Status: AC
  Administered 2013-07-23: 4 mg via INTRAVENOUS
  Filled 2013-07-23: qty 2

## 2013-07-23 MED ORDER — SODIUM CHLORIDE 0.9 % IV BOLUS (SEPSIS)
1000.0000 mL | Freq: Once | INTRAVENOUS | Status: AC
  Administered 2013-07-23: 1000 mL via INTRAVENOUS

## 2013-07-23 MED ORDER — THIAMINE HCL 100 MG/ML IJ SOLN
Freq: Once | INTRAVENOUS | Status: AC
  Administered 2013-07-23: 09:00:00 via INTRAVENOUS
  Filled 2013-07-23: qty 1000

## 2013-07-23 NOTE — ED Provider Notes (Addendum)
CSN: [id number]     Arrival date & time 07/23/13  3419 History   First MD Initiated Contact with Patient 07/23/13 [phone]     Chief Complaint  Patient presents with  . Emesis  . Diarrhea     (Consider location/radiation/quality/duration/timing/severity/associated sxs/prior Treatment) Patient is a 48 y.o. male presenting with vomiting and diarrhea. The history is provided by the patient. No language interpreter was used.  Emesis Severity:  Moderate Associated symptoms: abdominal pain and diarrhea   Associated symptoms: no chills   Associated symptoms comment:  He presents with complaint of N, V, D for the past 2-3 days. No fever, bloody emesis or stool. He has been drinking alcohol heavily over 2 weeks since he and his wife separated nearing their 27th anniversay. He denies SI/HI, hallucinations. He does not want evaluation for alcohol dependence today. Last alcohol use was yesterday. No history of alcoholic seizure or withdrawal in the past.  Diarrhea Associated symptoms: abdominal pain and vomiting   Associated symptoms: no chills and no fever     Past Medical History  Diagnosis Date  . Back pain   . Asthma   . COPD (chronic obstructive pulmonary disease)    History reviewed. No pertinent past surgical history. No family history on file. History  Substance Use Topics  . Smoking status: Current Every Day Smoker  . Smokeless tobacco: Not on file  . Alcohol Use: Yes     Comment: occasional    Review of Systems  Constitutional: Negative for fever and chills.  HENT: Negative.   Respiratory: Negative.   Cardiovascular: Negative.   Gastrointestinal: Positive for nausea, vomiting, abdominal pain and diarrhea.  Musculoskeletal: Negative.   Skin: Negative.   Neurological: Positive for tremors. Negative for seizures and syncope.      Allergies  Review of patient's allergies indicates no known allergies.  Home Medications   Prior to Admission medications   Medication Sig  Start Date End Date Taking? Authorizing Provider  albuterol (PROVENTIL HFA;VENTOLIN HFA) 108 (90 BASE) MCG/ACT inhaler Inhale 3-4 puffs into the lungs every 6 (six) hours as needed for wheezing or shortness of breath.   Yes Historical Provider, MD  albuterol (PROVENTIL) (2.5 MG/3ML) 0.083% nebulizer solution Take 2.5 mg by nebulization every 6 (six) hours as needed for wheezing or shortness of breath. For shortness of breath   Yes Historical Provider, MD  aspirin (BAYER ASPIRIN EXTRA STRENGTH) 500 MG tablet Take 1,500 mg by mouth every 6 (six) hours as needed for pain.   Yes Historical Provider, MD  Soft Lens Products (MULTI-PURPOSE SOLUTION) SOLN Place 3-4 drops into both eyes daily as needed (dry eyes).    Yes Historical Provider, MD   BP 158/95  Pulse 102  Temp(Src) 97.5 F (36.4 C) (Oral)  Resp 18  SpO2 100% Physical Exam  Constitutional: He is oriented to person, place, and time. He appears well-developed and well-nourished. No distress.  Ill but non-toxic appearing male   HENT:  Mouth/Throat: Mucous membranes are dry.  Cardiovascular: Normal rate.   No murmur heard. Pulmonary/Chest: Effort normal. He has no wheezes. He has no rales.  Abdominal: Soft. There is no rebound and no guarding.  Diffusely tender abdomen without peritoneal signs.   Musculoskeletal: Normal range of motion.  Neurological: He is alert and oriented to person, place, and time.  Skin: Skin is warm and dry.    ED Course  Procedures (including critical care time) Labs Review Labs Reviewed  CBC WITH DIFFERENTIAL - Abnormal; Notable for  the following:    WBC 11.6 (*)    Neutrophils Relative % 87 (*)    Neutro Abs 10.0 (*)    Lymphocytes Relative 7 (*)    All other components within normal limits  COMPREHENSIVE METABOLIC PANEL  LIPASE, BLOOD  ETHANOL   Results for orders placed during the hospital encounter of 07/23/13  CBC WITH DIFFERENTIAL      Result Value Ref Range   WBC 11.6 (*) 4.0 - 10.5 K/uL    RBC 5.00  4.22 - 5.81 MIL/uL   Hemoglobin 16.3  13.0 - 17.0 g/dL   HCT 49.0  39.0 - 52.0 %   MCV 98.0  78.0 - 100.0 fL   MCH 32.6  26.0 - 34.0 pg   MCHC 33.3  30.0 - 36.0 g/dL   RDW 14.0  11.5 - 15.5 %   Platelets 269  150 - 400 K/uL   Neutrophils Relative % 87 (*) 43 - 77 %   Neutro Abs 10.0 (*) 1.7 - 7.7 K/uL   Lymphocytes Relative 7 (*) 12 - 46 %   Lymphs Abs 0.8  0.7 - 4.0 K/uL   Monocytes Relative 3  3 - 12 %   Monocytes Absolute 0.4  0.1 - 1.0 K/uL   Eosinophils Relative 3  0 - 5 %   Eosinophils Absolute 0.4  0.0 - 0.7 K/uL   Basophils Relative 0  0 - 1 %   Basophils Absolute 0.0  0.0 - 0.1 K/uL  COMPREHENSIVE METABOLIC PANEL      Result Value Ref Range   Sodium 140  137 - 147 mEq/L   Potassium 4.2  3.7 - 5.3 mEq/L   Chloride 100  96 - 112 mEq/L   CO2 24  19 - 32 mEq/L   Glucose, Bld 135 (*) 70 - 99 mg/dL   BUN 13  6 - 23 mg/dL   Creatinine, Ser 0.91  0.50 - 1.35 mg/dL   Calcium 9.0  8.4 - 10.5 mg/dL   Total Protein 7.7  6.0 - 8.3 g/dL   Albumin 3.9  3.5 - 5.2 g/dL   AST 26  0 - 37 U/L   ALT 22  0 - 53 U/L   Alkaline Phosphatase 81  39 - 117 U/L   Total Bilirubin 0.4  0.3 - 1.2 mg/dL   GFR calc non Af Amer >90  >90 mL/min   GFR calc Af Amer >90  >90 mL/min   Anion gap 16 (*) 5 - 15  LIPASE, BLOOD      Result Value Ref Range   Lipase 25  11 - 59 U/L  ETHANOL      Result Value Ref Range   Alcohol, Ethyl (B) <11  0 - 11 mg/dL    Imaging Review No results found.   EKG Interpretation   Date/Time:  Monday July 23 2013 07:57:57 EDT Ventricular Rate:  82 PR Interval:  115 QRS Duration: 91 QT Interval:  381 QTC Calculation: 445 R Axis:   88 Text Interpretation:  Sinus rhythm Borderline short PR interval Left  ventricular hypertrophy ST elev, probable normal early repol pattern No  significant change since last tracing Confirmed by [place]  ([id number]) on 07/23/2013 8:05:47 AM      MDM   Final diagnoses:  None    1. Dehydration  He feels much  better after IV fluids, GI cocktail with resolution of pain. VSS. He declines need for detox or help with alcohol dependence.  He reports established follow up with outpatient resources already in place. No SI/HI. Stable for discharge.     Dewaine Oats, PA-C 07/23/13 1058  Dewaine Oats, PA-C 08/24/13 1724

## 2013-07-23 NOTE — Discharge Instructions (Signed)
Dehydration, Adult °Dehydration is when you lose more fluids from the body than you take in. Vital organs like the kidneys, brain, and heart cannot function without a proper amount of fluids and salt. Any loss of fluids from the body can cause dehydration.  °CAUSES  °· Vomiting. °· Diarrhea. °· Excessive sweating. °· Excessive urine output. °· Fever. °SYMPTOMS  °Mild dehydration °· Thirst. °· Dry lips. °· Slightly dry mouth. °Moderate dehydration °· Very dry mouth. °· Sunken eyes. °· Skin does not bounce back quickly when lightly pinched and released. °· Dark urine and decreased urine production. °· Decreased tear production. °· Headache. °Severe dehydration °· Very dry mouth. °· Extreme thirst. °· Rapid, weak pulse (more than 100 beats per minute at rest). °· Cold hands and feet. °· Not able to sweat in spite of heat and temperature. °· Rapid breathing. °· Blue lips. °· Confusion and lethargy. °· Difficulty being awakened. °· Minimal urine production. °· No tears. °DIAGNOSIS  °Your caregiver will diagnose dehydration based on your symptoms and your exam. Blood and urine tests will help confirm the diagnosis. The diagnostic evaluation should also identify the cause of dehydration. °TREATMENT  °Treatment of mild or moderate dehydration can often be done at home by increasing the amount of fluids that you drink. It is best to drink small amounts of fluid more often. Drinking too much at one time can make vomiting worse. Refer to the home care instructions below. °Severe dehydration needs to be treated at the hospital where you will probably be given intravenous (IV) fluids that contain water and electrolytes. °HOME CARE INSTRUCTIONS  °· Ask your caregiver about specific rehydration instructions. °· Drink enough fluids to keep your urine clear or pale yellow. °· Drink small amounts frequently if you have nausea and vomiting. °· Eat as you normally do. °· Avoid: °¨ Foods or drinks high in sugar. °¨ Carbonated  drinks. °¨ Juice. °¨ Extremely hot or cold fluids. °¨ Drinks with caffeine. °¨ Fatty, greasy foods. °¨ Alcohol. °¨ Tobacco. °¨ Overeating. °¨ Gelatin desserts. °· Wash your hands well to avoid spreading bacteria and viruses. °· Only take over-the-counter or prescription medicines for pain, discomfort, or fever as directed by your caregiver. °· Ask your caregiver if you should continue all prescribed and over-the-counter medicines. °· Keep all follow-up appointments with your caregiver. °SEEK MEDICAL CARE IF: °· You have abdominal pain and it increases or stays in one area (localizes). °· You have a rash, stiff neck, or severe headache. °· You are irritable, sleepy, or difficult to awaken. °· You are weak, dizzy, or extremely thirsty. °SEEK IMMEDIATE MEDICAL CARE IF:  °· You are unable to keep fluids down or you get worse despite treatment. °· You have frequent episodes of vomiting or diarrhea. °· You have blood or green matter (bile) in your vomit. °· You have blood in your stool or your stool looks black and tarry. °· You have not urinated in 6 to 8 hours, or you have only urinated a small amount of very dark urine. °· You have a fever. °· You faint. °MAKE SURE YOU:  °· Understand these instructions. °· Will watch your condition. °· Will get help right away if you are not doing well or get worse. °Document Released: 12/21/2004 Document Revised: 03/15/2011 Document Reviewed: 08/10/2010 °[employer]® Patient Information ©2015 [employer]. This information is not intended to replace advice given to you by your health care provider. Make sure you discuss any questions you have with your health care   provider. Chemical Dependency Chemical dependency is an addiction to drugs or alcohol. It is characterized by the repeated behavior of seeking out and using drugs and alcohol despite harmful consequences to the health and safety of ones self and others.  RISK FACTORS There are certain situations or behaviors that increase  a person's risk for chemical dependency. These include:  A family history of chemical dependency.  A history of mental health issues, including depression and anxiety.  A home environment where drugs and alcohol are easily available to you.  Drug or alcohol use at a young age. SYMPTOMS  The following symptoms can indicate chemical dependency:  Inability to limit the use of drugs or alcohol.  Nausea, sweating, shakiness, and anxiety that occurs when alcohol or drugs are not being used.  An increase in amount of drugs or alcohol that is necessary to get drunk or high. People who experience these symptoms can assess their use of drugs and alcohol by asking themselves the following questions:  Have you been told by friends or family that they are worried about your use of alcohol or drugs?  Do friends and family ever tell you about things you did while drinking alcohol or using drugs that you do not remember?  Do you lie about using alcohol or drugs or about the amounts you use?  Do you have difficulty completing daily tasks unless you use alcohol or drugs?  Is the level of your work or school performance lower because of your drug or alcohol use?  Do you get sick from using drugs or alcohol but keep using anyway?  Do you feel uncomfortable in social situations unless you use alcohol or drugs?  Do you use drugs or alcohol to help forget problems? An answer of yes to any of these questions may indicate chemical dependency. Professional evaluation is suggested. Document Released: 12/15/2000 Document Revised: 03/15/2011 Document Reviewed: 02/26/2010 [facility] Patient Information 2015 [place], [place]. This information is not intended to replace advice given to you by your health care provider. Make sure you discuss any questions you have with your health care provider.

## 2013-07-23 NOTE — ED Provider Notes (Signed)
Medical screening examination/treatment/procedure(s) were performed by non-physician practitioner and as supervising physician I was immediately available for consultation/collaboration.   Neta Ehlers, MD 07/23/13 [phone]

## 2013-07-23 NOTE — ED Notes (Signed)
States vomiting and diarrhea  Started tghis am and then he would drink 2 sodas and then vomit agagin and he has the shakes  States had a lot of tequila this weekend nothing to eat since breakfast yesterday

## 2013-08-25 NOTE — ED Provider Notes (Signed)
Medical screening examination/treatment/procedure(s) were performed by non-physician practitioner and as supervising physician I was immediately available for consultation/collaboration.   EKG Interpretation   Date/Time:  Monday July 23 2013 07:57:57 EDT Ventricular Rate:  82 PR Interval:  115 QRS Duration: 91 QT Interval:  381 QTC Calculation: 445 R Axis:   88 Text Interpretation:  Sinus rhythm Borderline short PR interval Left  ventricular hypertrophy ST elev, probable normal early repol pattern No  significant change since last tracing Confirmed by Fidelis  MD, [place]  ([id number]) on 07/23/2013 8:05:47 AM        Ernestina Patches, MD 08/25/13 [phone]

## 2013-09-23 ENCOUNTER — Encounter (HOSPITAL_COMMUNITY): Payer: Self-pay | Admitting: Emergency Medicine

## 2013-09-23 ENCOUNTER — Emergency Department (HOSPITAL_COMMUNITY)
Admission: EM | Admit: 2013-09-23 | Discharge: 2013-09-23 | Disposition: A | Discharge status: Home / Self Care | Payer: No Typology Code available for payment source | Attending: Emergency Medicine | Admitting: Emergency Medicine

## 2013-09-23 ENCOUNTER — Emergency Department (HOSPITAL_COMMUNITY): Payer: No Typology Code available for payment source

## 2013-09-23 DIAGNOSIS — S298XXA Other specified injuries of thorax, initial encounter: Secondary | ICD-10-CM | POA: Insufficient documentation

## 2013-09-23 DIAGNOSIS — Z79899 Other long term (current) drug therapy: Secondary | ICD-10-CM | POA: Diagnosis not present

## 2013-09-23 DIAGNOSIS — R296 Repeated falls: Secondary | ICD-10-CM | POA: Insufficient documentation

## 2013-09-23 DIAGNOSIS — S2249XA Multiple fractures of ribs, unspecified side, initial encounter for closed fracture: Secondary | ICD-10-CM | POA: Diagnosis not present

## 2013-09-23 DIAGNOSIS — Y9289 Other specified places as the place of occurrence of the external cause: Secondary | ICD-10-CM | POA: Insufficient documentation

## 2013-09-23 DIAGNOSIS — F17200 Nicotine dependence, unspecified, uncomplicated: Secondary | ICD-10-CM | POA: Diagnosis not present

## 2013-09-23 DIAGNOSIS — J4489 Other specified chronic obstructive pulmonary disease: Secondary | ICD-10-CM | POA: Insufficient documentation

## 2013-09-23 DIAGNOSIS — S2231XA Fracture of one rib, right side, initial encounter for closed fracture: Secondary | ICD-10-CM

## 2013-09-23 DIAGNOSIS — J449 Chronic obstructive pulmonary disease, unspecified: Secondary | ICD-10-CM | POA: Diagnosis not present

## 2013-09-23 DIAGNOSIS — Y9389 Activity, other specified: Secondary | ICD-10-CM | POA: Diagnosis not present

## 2013-09-23 DIAGNOSIS — X500XXA Overexertion from strenuous movement or load, initial encounter: Secondary | ICD-10-CM | POA: Diagnosis not present

## 2013-09-23 MED ORDER — OXYCODONE-ACETAMINOPHEN 5-325 MG PO TABS: 1.0000 | ORAL_TABLET | Freq: Four times a day (QID) | ORAL | Status: DC | PRN

## 2013-09-23 MED ORDER — OXYCODONE-ACETAMINOPHEN 5-325 MG PO TABS
2.0000 | ORAL_TABLET | Freq: Once | ORAL | Status: AC
  Administered 2013-09-23: 2 via ORAL
  Filled 2013-09-23: qty 2

## 2013-09-23 NOTE — ED Notes (Signed)
Pt reports falling two weeks ago and had right rib pain since. Was having asthma attack yesterday and reports coughing and felt something pop to right ribs and having increase in pain.

## 2013-09-23 NOTE — ED Provider Notes (Signed)
CSN: [id number]     Arrival date & time 09/23/13  1012 History   First MD Initiated Contact with Patient 09/23/13 1019     Chief Complaint  Patient presents with  . Fall     (Consider location/radiation/quality/duration/timing/severity/associated sxs/prior Treatment) Patient is a 48 y.o. male presenting with fall. The history is provided by the patient.  Fall Pertinent negatives include no chest pain, no abdominal pain, no headaches and no shortness of breath.  pt c/o right lateral/posterior chest pain s/p fall onto coffee table approximately 2 weeks ago.  Pain constant, dull, mod-severe, worse w movement, palpation, and positional changes. Pt states subsequently he coughed very hard, and felt/heard pop in same area, w worsening pain. No abd pain. No midline/spine pain. No radicular pain. Denies sob or any anterior/chest pain. Denies head injury or headache. No neck pain. Denies any other injury w fall, states was mechanical fall, no faintness or dizziness. No lower ext pain or swelling, no recent surgery, immobility, trauma or travel.  Pt is smoker. +non prod cough. No fever or chills.     Past Medical History  Diagnosis Date  . Back pain   . Asthma   . COPD (chronic obstructive pulmonary disease)    History reviewed. No pertinent past surgical history. History reviewed. No pertinent family history. History  Substance Use Topics  . Smoking status: Current Every Day Smoker  . Smokeless tobacco: Not on file  . Alcohol Use: Yes     Comment: occasional    Review of Systems  Constitutional: Negative for fever and chills.  HENT: Negative for sore throat.   Eyes: Negative for redness.  Respiratory: Positive for cough. Negative for shortness of breath.   Cardiovascular: Negative for chest pain and leg swelling.  Gastrointestinal: Negative for vomiting and abdominal pain.  Genitourinary: Negative for flank pain.  Musculoskeletal: Negative for back pain and neck pain.  Skin: Negative  for rash.  Neurological: Negative for headaches.  Hematological: Does not bruise/bleed easily.  Psychiatric/Behavioral: Negative for confusion.      Allergies  Review of patient's allergies indicates no known allergies.  Home Medications   Prior to Admission medications   Medication Sig Start Date End Date Taking? Authorizing Provider  albuterol (PROVENTIL HFA;VENTOLIN HFA) 108 (90 BASE) MCG/ACT inhaler Inhale 2-4 puffs into the lungs every 6 (six) hours as needed for wheezing or shortness of breath.    Yes Historical Provider, MD  albuterol (PROVENTIL) (2.5 MG/3ML) 0.083% nebulizer solution Take 2.5 mg by nebulization every 6 (six) hours as needed for wheezing or shortness of breath. For shortness of breath   Yes Historical Provider, MD  Emollient (AQUAPHOR EX) Apply 1 application topically as needed (for dry lips).   Yes Historical Provider, MD  ibuprofen (ADVIL,MOTRIN) 200 MG tablet Take 800 mg by mouth every 6 (six) hours as needed for moderate pain.   Yes Historical Provider, MD  Soft Lens Products (MULTI-PURPOSE SOLUTION) SOLN Place 3-4 drops into both eyes daily as needed (dry eyes).    Yes Historical Provider, MD   BP 133/88  Pulse 114  Temp(Src) 97.9 F (36.6 C) (Oral)  Resp 18  SpO2 98% Physical Exam  Nursing note and vitals reviewed. Constitutional: He is oriented to person, place, and time. He appears well-developed and well-nourished. No distress.  HENT:  Head: Atraumatic.  Mouth/Throat: Oropharynx is clear and moist.  Eyes: Conjunctivae are normal.  Neck: Neck supple. No tracheal deviation present.  Cardiovascular: Normal rate, regular rhythm, normal heart sounds  and intact distal pulses.  Exam reveals no gallop and no friction rub.   No murmur heard. Pulmonary/Chest: Effort normal and breath sounds normal. No accessory muscle usage. No respiratory distress. He exhibits tenderness.  Right chest wall tenderness posterior/lateral reproducing symptoms. Symmetric chest  wall movement.   Abdominal: Soft. Bowel sounds are normal. He exhibits no distension. There is no tenderness.  Genitourinary:  No cva tenderness.  Musculoskeletal: Normal range of motion. He exhibits no edema and no tenderness.  CTLS spine, non tender, aligned, no step off.   Neurological: He is alert and oriented to person, place, and time.  Motor intact bil. Steady gait.   Skin: Skin is warm and dry. No rash noted. He is not diaphoretic.  No shingles/rash to area of pain.  Psychiatric: He has a normal mood and affect.    ED Course  Procedures (including critical care time)  Dg Ribs Unilateral W/chest Right  09/23/2013   CLINICAL DATA:  [place] and injured the right ribs approximately 2 weeks ago. Asthma exacerbation 2 days ago causing the patient to cough, at which time he heard an audible cracking in the right side of the chest.  EXAM: RIGHT RIBS AND CHEST - 3+ VIEW  COMPARISON:  No prior rib imaging.  Two-view chest x-ray 10/20/2012.  FINDINGS: Minimally displaced fracture involving the lateral 9th rib and nondisplaced fracture involving the anterolateral 8th rib. No other fractures.  Cardiomediastinal silhouette unremarkable, unchanged. Lungs clear. Bronchovascular markings normal. Pulmonary vascularity normal. No visible pleural effusions. No pneumothorax.  IMPRESSION: 1. Minimally displaced fracture involving the lateral right ninth rib and nondisplaced fracture involving the antral lateral right eighth rib. 2.  No acute cardiopulmonary disease.   Electronically Signed   By: Evangeline Dakin M.D.   On: 09/23/2013 13:19     MDM  Pt states has ride, does not have to drive. No meds pta.   Percocet po.  Xrays.  Reviewed nursing notes and prior charts for additional history.   Recheck pt comfortable. Hr 74, rr 16, pulse ox 98%.  Pt currently appears stable for d/c.      Mirna Mires, MD 09/23/13 1353

## 2013-09-23 NOTE — Discharge Instructions (Signed)
It was a pleasure to provide your care today.  We hope that you feel better.  Take motrin or aleve as need for pain. You may also take percocet as need for pain. No driving for the next 6 hours or when taking percocet. Also, do not take tylenol or acetaminophen containing medication when taking percocet.  Stay active, take full and deep breaths. Avoid smoking. Follow up with primary care doctor. Return to ER right away if worse, new symptoms, fevers, trouble breathing, other concern.   Rib Fracture A rib fracture is a break or crack in one of the bones of the ribs. The ribs are a group of long, curved bones that wrap around your chest and attach to your spine. They protect your lungs and other organs in the chest cavity. A broken or cracked rib is often painful, but most do not cause other problems. Most rib fractures heal on their own over time. However, rib fractures can be more serious if multiple ribs are broken or if broken ribs move out of place and push against other structures. CAUSES   A direct blow to the chest. For example, this could happen during contact sports, a car accident, or a fall against a hard object.  Repetitive movements with high force, such as pitching a baseball or having severe coughing spells. SYMPTOMS   Pain when you breathe in or cough.  Pain when someone presses on the injured area. DIAGNOSIS  Your caregiver will perform a physical exam. Various imaging tests may be ordered to confirm the diagnosis and to look for related injuries. These tests may include a chest X-ray, computed tomography (CT), magnetic resonance imaging (MRI), or a bone scan. TREATMENT  Rib fractures usually heal on their own in 1-3 months. The longer healing period is often associated with a continued cough or other aggravating activities. During the healing period, pain control is very important. Medication is usually given to control pain. Hospitalization or surgery may be needed for more  severe injuries, such as those in which multiple ribs are broken or the ribs have moved out of place.  HOME CARE INSTRUCTIONS   Avoid strenuous activity and any activities or movements that cause pain. Be careful during activities and avoid bumping the injured rib.  Gradually increase activity as directed by your caregiver.  Only take over-the-counter or prescription medications as directed by your caregiver. Do not take other medications without asking your caregiver first.  Apply ice to the injured area for the first 1-2 days after you have been treated or as directed by your caregiver. Applying ice helps to reduce inflammation and pain.  Put ice in a plastic bag.  Place a towel between your skin and the bag.   Leave the ice on for 15-20 minutes at a time, every 2 hours while you are awake.  Perform deep breathing as directed by your caregiver. This will help prevent pneumonia, which is a common complication of a broken rib. Your caregiver may instruct you to:  Take deep breaths several times a day.  Try to cough several times a day, holding a pillow against the injured area.  Use a device called an incentive spirometer to practice deep breathing several times a day.  Drink enough fluids to keep your urine clear or pale yellow. This will help you avoid constipation.   Do not wear a rib belt or binder. These restrict breathing, which can lead to pneumonia.  SEEK IMMEDIATE MEDICAL CARE IF:   You  have a fever.   You have difficulty breathing or shortness of breath.   You develop a continual cough, or you cough up thick or bloody sputum.  You feel sick to your stomach (nausea), throw up (vomit), or have abdominal pain.   You have worsening pain not controlled with medications.  MAKE SURE YOU:  Understand these instructions.  Will watch your condition.  Will get help right away if you are not doing well or get worse. Document Released: 12/21/2004 Document Revised:  08/23/2012 Document Reviewed: 02/23/2012 [facility] Patient Information 2015 [place], [place]. This information is not intended to replace advice given to you by your health care provider. Make sure you discuss any questions you have with your health care provider.   Chest Contusion A chest contusion is a deep bruise on your chest area. Contusions are the result of an injury that caused bleeding under the skin. A chest contusion may involve bruising of the skin, muscles, or ribs. The contusion may turn blue, purple, or yellow. Minor injuries will give you a painless contusion, but more severe contusions may stay painful and swollen for a few weeks. CAUSES  A contusion is usually caused by a blow, trauma, or direct force to an area of the body. SYMPTOMS   Swelling and redness of the injured area.  Discoloration of the injured area.  Tenderness and soreness of the injured area.  Pain. DIAGNOSIS  The diagnosis can be made by taking a history and performing a physical exam. An X-ray, CT scan, or MRI may be needed to determine if there were any associated injuries, such as broken bones (fractures) or internal injuries. TREATMENT  Often, the best treatment for a chest contusion is resting, icing, and applying cold compresses to the injured area. Deep breathing exercises may be recommended to reduce the risk of pneumonia. Over-the-counter medicines may also be recommended for pain control. HOME CARE INSTRUCTIONS   Put ice on the injured area.  Put ice in a plastic bag.  Place a towel between your skin and the bag.  Leave the ice on for 15-20 minutes, 03-04 times a day.  Only take over-the-counter or prescription medicines as directed by your caregiver. Your caregiver may recommend avoiding anti-inflammatory medicines (aspirin, ibuprofen, and naproxen) for 48 hours because these medicines may increase bruising.  Rest the injured area.  Perform deep-breathing exercises as directed by your  caregiver.  Stop smoking if you smoke.  Do not lift objects over 5 pounds (2.3 kg) for 3 days or longer if recommended by your caregiver. SEEK IMMEDIATE MEDICAL CARE IF:   You have increased bruising or swelling.  You have pain that is getting worse.  You have difficulty breathing.  You have dizziness, weakness, or fainting.  You have blood in your urine or stool.  You cough up or vomit blood.  Your swelling or pain is not relieved with medicines. MAKE SURE YOU:   Understand these instructions.  Will watch your condition.  Will get help right away if you are not doing well or get worse. Document Released: 09/15/2000 Document Revised: 09/15/2011 Document Reviewed: 06/14/2011 [facility] Patient Information 2015 [place], [place]. This information is not intended to replace advice given to you by your health care provider. Make sure you discuss any questions you have with your health care provider.    Smoking Hazards Smoking cigarettes is extremely bad for your health. Tobacco smoke has over 200 known poisons in it. It contains the poisonous gases nitrogen oxide and carbon monoxide.  There are over 60 chemicals in tobacco smoke that cause cancer. Some of the chemicals found in cigarette smoke include:   Cyanide.   Benzene.   Formaldehyde.   Methanol (wood alcohol).   Acetylene (fuel used in welding torches).   Ammonia.  Even smoking lightly shortens your life expectancy by several years. You can greatly reduce the risk of medical problems for you and your family by stopping now. Smoking is the most preventable cause of death and disease in our society. Within days of quitting smoking, your circulation improves, you decrease the risk of having a heart attack, and your lung capacity improves. There may be some increased phlegm in the first few days after quitting, and it may take months for your lungs to clear up completely. Quitting for 10 years reduces your risk of  developing lung cancer to almost that of a nonsmoker.  WHAT ARE THE RISKS OF SMOKING? Cigarette smokers have an increased risk of many serious medical problems, including:  Lung cancer.   Lung disease (such as pneumonia, bronchitis, and emphysema).   Heart attack and chest pain due to the heart not getting enough oxygen (angina).   Heart disease and peripheral blood vessel disease.   Hypertension.   Stroke.   Oral cancer (cancer of the lip, mouth, or voice box).   Bladder cancer.   Pancreatic cancer.   Cervical cancer.   Pregnancy complications, including premature birth.   Stillbirths and smaller newborn babies, birth defects, and genetic damage to sperm.   Early menopause.   Lower estrogen level for women.   Infertility.   Facial wrinkles.   Blindness.   Increased risk of broken bones (fractures).   Senile dementia.   Stomach ulcers and internal bleeding.   Delayed wound healing and increased risk of complications during surgery. Because of secondhand smoke exposure, children of smokers have an increased risk of the following:   Sudden infant death syndrome (SIDS).   Respiratory infections.   Lung cancer.   Heart disease.   Ear infections.  WHY IS SMOKING ADDICTIVE? Nicotine is the chemical agent in tobacco that is capable of causing addiction or dependence. When you smoke and inhale, nicotine is absorbed rapidly into the bloodstream through your lungs. Both inhaled and noninhaled nicotine may be addictive.  WHAT ARE THE BENEFITS OF QUITTING?  There are many health benefits to quitting smoking. Some are:   The likelihood of developing cancer and heart disease decreases. Health improvements are seen almost immediately.   Blood pressure, pulse rate, and breathing patterns start returning to normal soon after quitting.   People who quit may see an improvement in their overall quality of life.  HOW DO YOU QUIT  SMOKING? Smoking is an addiction with both physical and psychological effects, and longtime habits can be hard to change. Your health care provider can recommend:  Programs and community resources, which may include group support, education, or therapy.  Replacement products, such as patches, gum, and nasal sprays. Use these products only as directed. Do not replace cigarette smoking with electronic cigarettes (commonly called e-cigarettes). The safety of e-cigarettes is unknown, and some may contain harmful chemicals. FOR MORE INFORMATION  American Lung Association: www.lung.org  American Cancer Society: www.cancer.org Document Released: 01/29/2004 Document Revised: 10/11/2012 Document Reviewed: 06/12/2012 [facility] Patient Information 2015 [place], [place]. This information is not intended to replace advice given to you by your health care provider. Make sure you discuss any questions you have with your health care provider.

## 2013-10-08 ENCOUNTER — Emergency Department (HOSPITAL_COMMUNITY)
Admission: EM | Admit: 2013-10-08 | Discharge: 2013-10-08 | Disposition: A | Discharge status: Home / Self Care | Payer: No Typology Code available for payment source | Attending: Emergency Medicine | Admitting: Emergency Medicine

## 2013-10-08 ENCOUNTER — Encounter (HOSPITAL_COMMUNITY): Payer: Self-pay | Admitting: Emergency Medicine

## 2013-10-08 ENCOUNTER — Emergency Department (INDEPENDENT_AMBULATORY_CARE_PROVIDER_SITE_OTHER)
Admission: EM | Admit: 2013-10-08 | Discharge: 2013-10-08 | Disposition: A | Discharge status: Home / Self Care | Payer: No Typology Code available for payment source | Source: Home / Self Care | Attending: Family Medicine | Admitting: Family Medicine

## 2013-10-08 DIAGNOSIS — Z79899 Other long term (current) drug therapy: Secondary | ICD-10-CM | POA: Diagnosis not present

## 2013-10-08 DIAGNOSIS — F1420 Cocaine dependence, uncomplicated: Secondary | ICD-10-CM | POA: Diagnosis not present

## 2013-10-08 DIAGNOSIS — F1220 Cannabis dependence, uncomplicated: Secondary | ICD-10-CM | POA: Diagnosis not present

## 2013-10-08 DIAGNOSIS — J449 Chronic obstructive pulmonary disease, unspecified: Secondary | ICD-10-CM | POA: Insufficient documentation

## 2013-10-08 DIAGNOSIS — R251 Tremor, unspecified: Secondary | ICD-10-CM | POA: Diagnosis present

## 2013-10-08 DIAGNOSIS — F1520 Other stimulant dependence, uncomplicated: Secondary | ICD-10-CM | POA: Insufficient documentation

## 2013-10-08 DIAGNOSIS — N489 Disorder of penis, unspecified: Secondary | ICD-10-CM

## 2013-10-08 DIAGNOSIS — Z113 Encounter for screening for infections with a predominantly sexual mode of transmission: Secondary | ICD-10-CM | POA: Diagnosis not present

## 2013-10-08 DIAGNOSIS — R000 Tachycardia, unspecified: Secondary | ICD-10-CM | POA: Diagnosis not present

## 2013-10-08 DIAGNOSIS — F1920 Other psychoactive substance dependence, uncomplicated: Secondary | ICD-10-CM

## 2013-10-08 DIAGNOSIS — N4889 Other specified disorders of penis: Secondary | ICD-10-CM

## 2013-10-08 DIAGNOSIS — F10930 Alcohol use, unspecified with withdrawal, uncomplicated: Secondary | ICD-10-CM

## 2013-10-08 DIAGNOSIS — F10230 Alcohol dependence with withdrawal, uncomplicated: Secondary | ICD-10-CM

## 2013-10-08 DIAGNOSIS — Z720 Tobacco use: Secondary | ICD-10-CM | POA: Diagnosis not present

## 2013-10-08 DIAGNOSIS — Z711 Person with feared health complaint in whom no diagnosis is made: Secondary | ICD-10-CM

## 2013-10-08 LAB — RAPID URINE DRUG SCREEN, HOSP PERFORMED
Amphetamines: POSITIVE — AB
Barbiturates: NOT DETECTED
Benzodiazepines: NOT DETECTED
Cocaine: POSITIVE — AB
Opiates: NOT DETECTED
Tetrahydrocannabinol: POSITIVE — AB

## 2013-10-08 LAB — CBC WITH DIFFERENTIAL/PLATELET
Basophils Absolute: 0.1 10*3/uL (ref 0.0–0.1)
Basophils Relative: 1 % (ref 0–1)
Eosinophils Absolute: 0.1 10*3/uL (ref 0.0–0.7)
Eosinophils Relative: 1 % (ref 0–5)
HCT: 41.3 % (ref 39.0–52.0)
Hemoglobin: 14.6 g/dL (ref 13.0–17.0)
Lymphocytes Relative: 22 % (ref 12–46)
Lymphs Abs: 1.5 10*3/uL (ref 0.7–4.0)
MCH: 31.9 pg (ref 26.0–34.0)
MCHC: 35.4 g/dL (ref 30.0–36.0)
MCV: 90.2 fL (ref 78.0–100.0)
Monocytes Absolute: 0.8 10*3/uL (ref 0.1–1.0)
Monocytes Relative: 11 % (ref 3–12)
Neutro Abs: 4.5 10*3/uL (ref 1.7–7.7)
Neutrophils Relative %: 65 % (ref 43–77)
Platelets: 203 10*3/uL (ref 150–400)
RBC: 4.58 MIL/uL (ref 4.22–5.81)
RDW: 13.8 % (ref 11.5–15.5)
WBC: 7 10*3/uL (ref 4.0–10.5)

## 2013-10-08 LAB — ETHANOL: Alcohol, Ethyl (B): 26 mg/dL — ABNORMAL HIGH (ref 0–11)

## 2013-10-08 LAB — TROPONIN I: Troponin I: <0.3 ng/mL (ref <0.30)

## 2013-10-08 LAB — BASIC METABOLIC PANEL
Anion gap: 15 (ref 5–15)
BUN: 11 mg/dL (ref 6–23)
CO2: 24 mEq/L (ref 19–32)
Calcium: 8.7 mg/dL (ref 8.4–10.5)
Chloride: 99 mEq/L (ref 96–112)
Creatinine, Ser: 0.96 mg/dL (ref 0.50–1.35)
GFR calc Af Amer: >90 mL/min (ref >90)
GFR calc non Af Amer: >90 mL/min (ref >90)
Glucose, Bld: 175 mg/dL — ABNORMAL HIGH (ref 70–99)
Potassium: 3.9 mEq/L (ref 3.7–5.3)
Sodium: 138 mEq/L (ref 137–147)

## 2013-10-08 LAB — RPR

## 2013-10-08 LAB — HIV ANTIBODY (ROUTINE TESTING W REFLEX): HIV 1&2 Ab, 4th Generation: NONREACTIVE

## 2013-10-08 MED ORDER — SODIUM CHLORIDE 0.9 % IV BOLUS (SEPSIS)
1000.0000 mL | Freq: Once | INTRAVENOUS | Status: AC
  Administered 2013-10-08: 1000 mL via INTRAVENOUS

## 2013-10-08 MED ORDER — AZITHROMYCIN 1 G PO PACK
1.0000 g | PACK | Freq: Once | ORAL | Status: AC
  Administered 2013-10-08: 1 g via ORAL
  Filled 2013-10-08: qty 1

## 2013-10-08 MED ORDER — LIDOCAINE HCL (PF) 1 % IJ SOLN
INTRAMUSCULAR | Status: AC
  Administered 2013-10-08: 0.9 mL
  Filled 2013-10-08: qty 5

## 2013-10-08 MED ORDER — LORAZEPAM 2 MG/ML IJ SOLN
1.0000 mg | Freq: Once | INTRAMUSCULAR | Status: AC
  Administered 2013-10-08: 1 mg via INTRAVENOUS
  Filled 2013-10-08: qty 1

## 2013-10-08 MED ORDER — ONDANSETRON HCL 4 MG/2ML IJ SOLN
4.0000 mg | Freq: Once | INTRAMUSCULAR | Status: AC
  Administered 2013-10-08: 4 mg via INTRAVENOUS
  Filled 2013-10-08: qty 2

## 2013-10-08 MED ORDER — CEFTRIAXONE SODIUM 250 MG IJ SOLR
250.0000 mg | Freq: Once | INTRAMUSCULAR | Status: AC
  Administered 2013-10-08: 250 mg via INTRAMUSCULAR
  Filled 2013-10-08: qty 250

## 2013-10-08 NOTE — ED Notes (Signed)
Patient states he has been drinking an unknown substance and since that time he has been having hallucinations and shaking, patient states passed out x few hours, patient also with pain in right sided rib pain as well from previous broken ribs

## 2013-10-08 NOTE — Discharge Instructions (Signed)
Thank you for coming in today. I will call if labs come back positive. If you get worse tremors go to the emergency room.   Alcohol Withdrawal Anytime drug use is interfering with normal living activities it has become abuse. This includes problems with family and friends. Psychological dependence has developed when your mind tells you that the drug is needed. This is usually followed by physical dependence when a continuing increase of drugs are required to get the same feeling or "high." This is known as addiction or chemical dependency. A person's risk is much higher if there is a history of chemical dependency in the family. Mild Withdrawal Following Stopping Alcohol, When Addiction or Chemical Dependency Has Developed When a person has developed tolerance to alcohol, any sudden stopping of alcohol can cause uncomfortable physical symptoms. Most of the time these are mild and consist of tremors in the hands and increases in heart rate, breathing, and temperature. Sometimes these symptoms are associated with anxiety, panic attacks, and bad dreams. There may also be stomach upset. Normal sleep patterns are often interrupted with periods of inability to sleep (insomnia). This may last for 6 months. Because of this discomfort, many people choose to continue drinking to get rid of this discomfort and to try to feel normal. Severe Withdrawal with Decreased or No Alcohol Intake, When Addiction or Chemical Dependency Has Developed About five percent of alcoholics will develop signs of severe withdrawal when they stop using alcohol. One sign of this is development of generalized seizures (convulsions). Other signs of this are severe agitation and confusion. This may be associated with believing in things which are not real or seeing things which are not really there (delusions and hallucinations). Vitamin deficiencies are usually present if alcohol intake has been long-term. Treatment for this most often  requires hospitalization and close observation. Addiction can only be helped by stopping use of all chemicals. This is hard but may save your life. With continual alcohol use, possible outcomes are usually loss of self respect and esteem, violence, and death. Addiction cannot be cured but it can be stopped. This often requires outside help and the care of professionals. Treatment centers are listed in the yellow pages under Cocaine, Narcotics, and Alcoholics Anonymous. Most hospitals and clinics can refer you to a specialized care center. It is not necessary for you to go through the uncomfortable symptoms of withdrawal. Your caregiver can provide you with medicines that will help you through this difficult period. Try to avoid situations, friends, or drugs that made it possible for you to keep using alcohol in the past. Learn how to say no. It takes a long period of time to overcome addictions to all drugs, including alcohol. There may be many times when you feel as though you want a drink. After getting rid of the physical addiction and withdrawal, you will have a lessening of the craving which tells you that you need alcohol to feel normal. Call your caregiver if more support is needed. Learn who to talk to in your family and among your friends so that during these periods you can receive outside help. Alcoholics Anonymous (AA) has helped many people over the years. To get further help, contact AA or call your caregiver, counselor, or clergyperson. Al-Anon and Alateen are support groups for friends and family members of an alcoholic. The people who love and care for an alcoholic often need help, too. For information about these organizations, check your phone directory or call a local alcoholism  treatment center.  SEEK IMMEDIATE MEDICAL CARE IF:   You have a seizure.  You have a fever.  You experience uncontrolled vomiting or you vomit up blood. This may be bright red or look like black coffee  grounds.  You have blood in the stool. This may be bright red or appear as a black, tarry, bad-smelling stool.  You become lightheaded or faint. Do not drive if you feel this way. Have someone else drive you or call 528 for help.  You become more agitated or confused.  You develop uncontrolled anxiety.  You begin to see things that are not really there (hallucinate). Your caregiver has determined that you completely understand your medical condition, and that your mental state is back to normal. You understand that you have been treated for alcohol withdrawal, have agreed not to drink any alcohol for a minimum of 1 day, will not operate a car or other machinery for 24 hours, and have had an opportunity to ask any questions about your condition. Document Released: 09/30/2004 Document Revised: 03/15/2011 Document Reviewed: 08/09/2007 [facility] Patient Information 2015 [place], [place]. This information is not intended to replace advice given to you by your health care provider. Make sure you discuss any questions you have with your health care provider.

## 2013-10-08 NOTE — Discharge Instructions (Signed)
Chemical Dependency  Chemical dependency is an addiction to drugs or alcohol. It is characterized by the repeated behavior of seeking out and using drugs and alcohol despite harmful consequences to the health and safety of ones self and others.   RISK FACTORS  There are certain situations or behaviors that increase a person's risk for chemical dependency. These include:  · A family history of chemical dependency.  · A history of mental health issues, including depression and anxiety.  · A home environment where drugs and alcohol are easily available to you.  · Drug or alcohol use at a young age.  SYMPTOMS   The following symptoms can indicate chemical dependency:  · Inability to limit the use of drugs or alcohol.  · Nausea, sweating, shakiness, and anxiety that occurs when alcohol or drugs are not being used.  · An increase in amount of drugs or alcohol that is necessary to get drunk or high.  People who experience these symptoms can assess their use of drugs and alcohol by asking themselves the following questions:  · Have you been told by friends or family that they are worried about your use of alcohol or drugs?  · Do friends and family ever tell you about things you did while drinking alcohol or using drugs that you do not remember?  · Do you lie about using alcohol or drugs or about the amounts you use?  · Do you have difficulty completing daily tasks unless you use alcohol or drugs?  · Is the level of your work or school performance lower because of your drug or alcohol use?  · Do you get sick from using drugs or alcohol but keep using anyway?  · Do you feel uncomfortable in social situations unless you use alcohol or drugs?  · Do you use drugs or alcohol to help forget problems?   An answer of yes to any of these questions may indicate chemical dependency. Professional evaluation is suggested.  Document Released: 12/15/2000 Document Revised: 03/15/2011 Document Reviewed: 02/26/2010  [employer]® Patient  Information ©2015 [employer]. This information is not intended to replace advice given to you by your health care provider. Make sure you discuss any questions you have with your health care provider.

## 2013-10-08 NOTE — ED Notes (Signed)
Pt in today because he is concerned about a lesion on his penis, pt has also had tremors

## 2013-10-08 NOTE — ED Provider Notes (Signed)
Andres Harding is a 48 y.o. male who presents to Urgent Care today for penile lesion and shaking. Over the past multiple months Andres Harding has been drinking heavily. He stopped drinking yesterday. He notes that while he was drunk he had unprotected sexual encounters and has developed a small lesion at the tip of his penis. He was seen in the emergency room this morning for evaluation of the same and had multiple tests with the exception of a herpes test. He would like to be tested for herpes today. He feels somewhat shaky but denies any significant anxiety. He has never had alcohol or   Past Medical History  Diagnosis Date  . Back pain   . Asthma   . COPD (chronic obstructive pulmonary disease)    History  Substance Use Topics  . Smoking status: Current Every Day Smoker  . Smokeless tobacco: Not on file  . Alcohol Use: Yes     Comment: occasional   ROS as above Medications: No current facility-administered medications for this encounter.   Current Outpatient Prescriptions  Medication Sig Dispense Refill  . albuterol (PROVENTIL HFA;VENTOLIN HFA) 108 (90 BASE) MCG/ACT inhaler Inhale 2-4 puffs into the lungs every 6 (six) hours as needed for wheezing or shortness of breath.       Marland Kitchen albuterol (PROVENTIL) (2.5 MG/3ML) 0.083% nebulizer solution Take 2.5 mg by nebulization every 6 (six) hours as needed for wheezing or shortness of breath. For shortness of breath      . Emollient (AQUAPHOR EX) Apply 1 application topically as needed (for dry lips).      Marland Kitchen ibuprofen (ADVIL,MOTRIN) 200 MG tablet Take 800 mg by mouth every 6 (six) hours as needed for moderate pain.      . Multiple Vitamin (MULTIVITAMIN WITH MINERALS) TABS tablet Take 1 tablet by mouth daily.      Marland Kitchen oxyCODONE-acetaminophen (PERCOCET/ROXICET) 5-325 MG per tablet Take 1-2 tablets by mouth every 6 (six) hours as needed for severe pain.  30 tablet  0  . Soft Lens Products (MULTI-PURPOSE SOLUTION) SOLN Place 3-4 drops into both eyes  daily as needed (dry eyes).         Exam:  BP 150/100  Pulse 88  Temp(Src) 98.3 F (36.8 C) (Oral)  Resp 14  SpO2 98% Gen: Well NAD HEENT: EOMI,  MMM Lungs: Normal work of breathing. CTABL Heart: RRR no MRG Abd: NABS, Soft. Nondistended, Nontender Exts: Brisk capillary refill, warm and well perfused.  Gen.: Small crusted nontender lesion at the tip of the glans. No other penile lesions are present. Testicles are descended nontender bilaterally. No anal lesions visible. Neuro: Alert and oriented. Faint tremor bilaterally. No clonus and normal reflexes bilaterally BL. Normal gait.  Results for orders placed during the hospital encounter of 10/08/13 (from the past 24 hour(s))  CBC WITH DIFFERENTIAL     Status: None   Collection Time    10/08/13  5:03 AM      Result Value Ref Range   WBC 7.0  4.0 - 10.5 K/uL   RBC 4.58  4.22 - 5.81 MIL/uL   Hemoglobin 14.6  13.0 - 17.0 g/dL   HCT 41.3  39.0 - 52.0 %   MCV 90.2  78.0 - 100.0 fL   MCH 31.9  26.0 - 34.0 pg   MCHC 35.4  30.0 - 36.0 g/dL   RDW 13.8  11.5 - 15.5 %   Platelets 203  150 - 400 K/uL   Neutrophils Relative % 65  43 - 77 %   Neutro Abs 4.5  1.7 - 7.7 K/uL   Lymphocytes Relative 22  12 - 46 %   Lymphs Abs 1.5  0.7 - 4.0 K/uL   Monocytes Relative 11  3 - 12 %   Monocytes Absolute 0.8  0.1 - 1.0 K/uL   Eosinophils Relative 1  0 - 5 %   Eosinophils Absolute 0.1  0.0 - 0.7 K/uL   Basophils Relative 1  0 - 1 %   Basophils Absolute 0.1  0.0 - 0.1 K/uL  BASIC METABOLIC PANEL     Status: Abnormal   Collection Time    10/08/13  5:03 AM      Result Value Ref Range   Sodium 138  137 - 147 mEq/L   Potassium 3.9  3.7 - 5.3 mEq/L   Chloride 99  96 - 112 mEq/L   CO2 24  19 - 32 mEq/L   Glucose, Bld 175 (*) 70 - 99 mg/dL   BUN 11  6 - 23 mg/dL   Creatinine, Ser 0.96  0.50 - 1.35 mg/dL   Calcium 8.7  8.4 - 10.5 mg/dL   GFR calc non Af Amer >90  >90 mL/min   GFR calc Af Amer >90  >90 mL/min   Anion gap 15  5 - 15  TROPONIN I      Status: None   Collection Time    10/08/13  5:03 AM      Result Value Ref Range   Troponin I <0.30  <0.30 ng/mL  ETHANOL     Status: Abnormal   Collection Time    10/08/13  5:03 AM      Result Value Ref Range   Alcohol, Ethyl (B) 26 (*) 0 - 11 mg/dL  RPR     Status: None   Collection Time    10/08/13  5:39 AM      Result Value Ref Range   RPR NON REAC  NON REAC  HIV ANTIBODY (ROUTINE TESTING)     Status: None   Collection Time    10/08/13  5:39 AM      Result Value Ref Range   HIV 1&2 Ab, 4th Generation NONREACTIVE  NONREACTIVE  URINE RAPID DRUG SCREEN (HOSP PERFORMED)     Status: Abnormal   Collection Time    10/08/13  6:06 AM      Result Value Ref Range   Opiates NONE DETECTED  NONE DETECTED   Cocaine POSITIVE (*) NONE DETECTED   Benzodiazepines NONE DETECTED  NONE DETECTED   Amphetamines POSITIVE (*) NONE DETECTED   Tetrahydrocannabinol POSITIVE (*) NONE DETECTED   Barbiturates NONE DETECTED  NONE DETECTED   No results found.  Assessment and Plan: 47 y.o. male with  1) penile lesion: Unclear etiology. HSV pending. 2) tremor: Likely alcohol withdrawal. I recommended patient to the emergency department for detox. He declines. Advised to go to the ER for worsening symptoms.  Discussed warning signs or symptoms. Please see discharge instructions. Patient expresses understanding.     Gregor Hams, MD 10/08/13 [phone]

## 2013-10-09 LAB — GC/CHLAMYDIA PROBE AMP
CT Probe RNA: POSITIVE — AB
GC Probe RNA: NEGATIVE

## 2013-10-10 ENCOUNTER — Telehealth (HOSPITAL_COMMUNITY): Payer: Self-pay

## 2013-10-10 LAB — HERPES SIMPLEX VIRUS CULTURE: Culture: NOT DETECTED

## 2013-10-17 ENCOUNTER — Encounter (HOSPITAL_COMMUNITY): Payer: Self-pay | Admitting: Emergency Medicine

## 2013-10-17 ENCOUNTER — Emergency Department (HOSPITAL_COMMUNITY)

## 2013-10-17 ENCOUNTER — Emergency Department (HOSPITAL_COMMUNITY)
Admission: EM | Admit: 2013-10-17 | Discharge: 2013-10-17 | Disposition: A | Discharge status: Home / Self Care | Attending: Emergency Medicine | Admitting: Emergency Medicine

## 2013-10-17 DIAGNOSIS — Y9289 Other specified places as the place of occurrence of the external cause: Secondary | ICD-10-CM | POA: Insufficient documentation

## 2013-10-17 DIAGNOSIS — S59902A Unspecified injury of left elbow, initial encounter: Secondary | ICD-10-CM | POA: Diagnosis present

## 2013-10-17 DIAGNOSIS — S53102A Unspecified subluxation of left ulnohumeral joint, initial encounter: Secondary | ICD-10-CM | POA: Diagnosis not present

## 2013-10-17 DIAGNOSIS — J449 Chronic obstructive pulmonary disease, unspecified: Secondary | ICD-10-CM | POA: Diagnosis not present

## 2013-10-17 DIAGNOSIS — Z79899 Other long term (current) drug therapy: Secondary | ICD-10-CM | POA: Insufficient documentation

## 2013-10-17 DIAGNOSIS — Z720 Tobacco use: Secondary | ICD-10-CM | POA: Insufficient documentation

## 2013-10-17 DIAGNOSIS — Y9389 Activity, other specified: Secondary | ICD-10-CM | POA: Diagnosis not present

## 2013-10-17 DIAGNOSIS — S53105A Unspecified dislocation of left ulnohumeral joint, initial encounter: Secondary | ICD-10-CM

## 2013-10-17 DIAGNOSIS — X58XXXA Exposure to other specified factors, initial encounter: Secondary | ICD-10-CM | POA: Insufficient documentation

## 2013-10-17 HISTORY — DX: Emphysema, unspecified: J43.9

## 2013-10-17 MED ORDER — MIDAZOLAM HCL 5 MG/5ML IJ SOLN
INTRAMUSCULAR | Status: AC | PRN
  Administered 2013-10-17: 0.5 mg via INTRAVENOUS

## 2013-10-17 MED ORDER — HYDROCODONE-ACETAMINOPHEN 5-325 MG PO TABS: 2.0000 | ORAL_TABLET | ORAL | Status: DC | PRN

## 2013-10-17 MED ORDER — MIDAZOLAM HCL 2 MG/2ML IJ SOLN
4.0000 mg | Freq: Once | INTRAMUSCULAR | Status: AC
  Administered 2013-10-17: 4 mg via INTRAVENOUS
  Filled 2013-10-17: qty 4

## 2013-10-17 MED ORDER — SODIUM CHLORIDE 0.9 % IV SOLN
INTRAVENOUS | Status: AC | PRN
  Administered 2013-10-17: 50 mL/h via INTRAVENOUS

## 2013-10-17 MED ORDER — OXYCODONE-ACETAMINOPHEN 5-325 MG PO TABS
2.0000 | ORAL_TABLET | Freq: Once | ORAL | Status: AC
  Administered 2013-10-17: 2 via ORAL
  Filled 2013-10-17: qty 2

## 2013-10-17 MED ORDER — MIDAZOLAM HCL 2 MG/2ML IJ SOLN
INTRAMUSCULAR | Status: AC | PRN
  Administered 2013-10-17 (×2): 0.5 mg via INTRAVENOUS
  Administered 2013-10-17 (×2): 1 mg via INTRAVENOUS
  Administered 2013-10-17: 0.5 mg via INTRAVENOUS

## 2013-10-17 MED ORDER — FENTANYL CITRATE 0.05 MG/ML IJ SOLN
100.0000 ug | Freq: Once | INTRAMUSCULAR | Status: AC
  Administered 2013-10-17: 100 ug via INTRAVENOUS
  Filled 2013-10-17: qty 2
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
## 2013-10-17 NOTE — Discharge Instructions (Signed)
Elbow Dislocation  Elbow dislocation is the displacement of the bones that form the elbow joint. Three bones come together to form the elbow. The humerus is the bone in the upper arm. The radius and ulna are the 2 bones in the forearm that form the lower part of the elbow. The elbow is held in place by very strong, fibrous tissues (ligaments) that connect the bones to each other.  CAUSES  Elbow dislocations are not common. Typically, they occur when a person falls forward with hands and elbows outstretched. The force of the impact is sent to the elbow. Usually, there is a twisting motion in this force. Elbow dislocations also happen during car crashes when passengers reach out to brace themselves during the impact.  RISK FACTORS  Although dislocation of the elbow can happen to anyone, some people are at greater risk than others. People at increased risk of elbow dislocation include:  · People born with greater looseness in their ligaments.  · People born with an ulna bone that has a shallow groove for the elbow hinge joint.  SYMPTOMS  Symptoms of a complete elbow dislocation usually are obvious. They include extreme pain and the appearance of a deformed arm.   Symptoms of a partial dislocation may not be obvious. Your elbow may move somewhat, but you may have pain and swelling. Also, there will likely be bruising on the inside and outside of your elbow where ligaments have been stretched or torn.   DIAGNOSIS   To diagnose elbow dislocation, your caregiver will perform a physical exam. During this exam, your caregiver will check your arm for tenderness, swelling, and deformity. The skin around your arm and the circulation in your arm also will be checked. Your pulse will be checked at your wrist. If your artery is injured during dislocation, your hand will be cool to the touch and may be white or purple in color. Your caregiver also may check your arm and your ability to move your wrist and fingers to see if you had  any damage to your nerves during dislocation.  An X-ray exam also may be done to determine if there is bone injury. Results of an X-ray exam can help show the direction of the dislocation.  If you have a simple dislocation, there is no major bone injury. If you have a complex dislocation, you may have broken bones (fractures) associated with the ligament injuries.  TREATMENT  For a simple elbow dislocation, your bones can usually be realigned in a procedure called a reduction. This is a treatment in which your bones are manually moved back into place either with the use of numbing medicine (regional anesthetic) around your elbow or medicine to make you sleep (general anesthetic). Then your elbow is kept immobile with a sling or a splint for 2 to 3 weeks. This is followed with physical therapy to help your joint move again.  Complex elbow dislocation may require surgery to restore joint alignment and repair ligaments. After surgery, your elbow may be protected with an external hinge. This device keeps your elbow from dislocating again while motion exercises are done. Additional surgery may be needed to repair any injuries to blood vessels and nerves or bones and ligaments or to relieve pressure from excessive swelling around the muscles.  HOME CARE INSTRUCTIONS  The following measures can help to reduce pain and hasten the healing process:  · Rest your injured joint. Do not move it. Avoid activities similar to the one that caused   your injury.  · Exercise your hand and fingers as instructed by your caregiver.  · Apply ice to your injured joint for 1 to 2 days after your reduction or as directed by your caregiver. Applying ice helps to reduce inflammation and pain.  ¨ Put ice in a plastic bag.  ¨ Place a towel between your skin and the bag.  ¨ Leave the ice on for 15 to 20 minutes at a time, every couple of hours while you are awake.  · Elevate your arm above your heart and move your wrist and fingers as instructed by  your caregiver to help limit swelling.  · Take over-the-counter or prescription medicines for pain as directed by your caregiver.  SEEK IMMEDIATE MEDICAL CARE IF:  · Your splint becomes damaged.  · You have an external hinge and it becomes loose or will not move.  · You have an external hinge and you develop drainage around the pins.  · Your pain becomes worse rather than better.  · You lose feeling in your hand or fingers.  MAKE SURE YOU:  · Understand these instructions.  · Will watch your condition.  · Will get help right away if you are not doing well or get worse.  Document Released: 12/15/2000 Document Revised: 03/15/2011 Document Reviewed: 05/21/2010  [employer]® Patient Information ©2015 [employer]. This information is not intended to replace advice given to you by your health care provider. Make sure you discuss any questions you have with your health care provider.
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
## 2013-10-17 NOTE — ED Notes (Signed)
Patient transported to X-ray 
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
## 2014-01-16 ENCOUNTER — Encounter (HOSPITAL_COMMUNITY): Payer: Self-pay | Admitting: Emergency Medicine

## 2014-01-16 ENCOUNTER — Emergency Department (HOSPITAL_COMMUNITY)
Admission: EM | Admit: 2014-01-16 | Discharge: 2014-01-16 | Disposition: A | Discharge status: Home / Self Care | Payer: No Typology Code available for payment source | Attending: Emergency Medicine | Admitting: Emergency Medicine

## 2014-01-16 DIAGNOSIS — Z760 Encounter for issue of repeat prescription: Secondary | ICD-10-CM | POA: Diagnosis not present

## 2014-01-16 DIAGNOSIS — M25512 Pain in left shoulder: Secondary | ICD-10-CM | POA: Diagnosis not present

## 2014-01-16 DIAGNOSIS — Z720 Tobacco use: Secondary | ICD-10-CM | POA: Insufficient documentation

## 2014-01-16 DIAGNOSIS — X58XXXA Exposure to other specified factors, initial encounter: Secondary | ICD-10-CM | POA: Insufficient documentation

## 2014-01-16 DIAGNOSIS — R369 Urethral discharge, unspecified: Secondary | ICD-10-CM | POA: Diagnosis present

## 2014-01-16 DIAGNOSIS — Y9289 Other specified places as the place of occurrence of the external cause: Secondary | ICD-10-CM | POA: Diagnosis not present

## 2014-01-16 DIAGNOSIS — Y9389 Activity, other specified: Secondary | ICD-10-CM | POA: Insufficient documentation

## 2014-01-16 DIAGNOSIS — J449 Chronic obstructive pulmonary disease, unspecified: Secondary | ICD-10-CM | POA: Insufficient documentation

## 2014-01-16 DIAGNOSIS — Z79899 Other long term (current) drug therapy: Secondary | ICD-10-CM | POA: Diagnosis not present

## 2014-01-16 DIAGNOSIS — N342 Other urethritis: Secondary | ICD-10-CM | POA: Diagnosis not present

## 2014-01-16 DIAGNOSIS — Y998 Other external cause status: Secondary | ICD-10-CM | POA: Insufficient documentation

## 2014-01-16 DIAGNOSIS — Z113 Encounter for screening for infections with a predominantly sexual mode of transmission: Secondary | ICD-10-CM | POA: Diagnosis not present

## 2014-01-16 DIAGNOSIS — T39311A Poisoning by propionic acid derivatives, accidental (unintentional), initial encounter: Secondary | ICD-10-CM | POA: Diagnosis not present

## 2014-01-16 DIAGNOSIS — S0502XA Injury of conjunctiva and corneal abrasion without foreign body, left eye, initial encounter: Secondary | ICD-10-CM | POA: Diagnosis not present

## 2014-01-16 LAB — I-STAT CHEM 8, ED
BUN: 13 mg/dL (ref 6–23)
CALCIUM ION: 1.16 mmol/L (ref 1.12–1.23)
CREATININE: 1 mg/dL (ref 0.50–1.35)
Chloride: 102 mEq/L (ref 96–112)
Glucose, Bld: 91 mg/dL (ref 70–99)
HCT: 49 % (ref 39.0–52.0)
Hemoglobin: 16.7 g/dL (ref 13.0–17.0)
Potassium: 4.2 mmol/L (ref 3.5–5.1)
Sodium: 140 mmol/L (ref 135–145)
TCO2: 25 mmol/L (ref 0–100)

## 2014-01-16 MED ORDER — LIDOCAINE HCL (PF) 1 % IJ SOLN
5.0000 mL | Freq: Once | INTRAMUSCULAR | Status: AC
  Administered 2014-01-16: 5 mL
  Filled 2014-01-16: qty 5

## 2014-01-16 MED ORDER — TRAMADOL HCL 50 MG PO TABS: 50.0000 mg | ORAL_TABLET | Freq: Four times a day (QID) | ORAL | Status: DC | PRN

## 2014-01-16 MED ORDER — TETRACAINE HCL 0.5 % OP SOLN
2.0000 [drp] | Freq: Once | OPHTHALMIC | Status: AC
  Administered 2014-01-16: 2 [drp] via OPHTHALMIC
  Filled 2014-01-16: qty 2

## 2014-01-16 MED ORDER — CIPROFLOXACIN HCL 0.3 % OP SOLN
2.0000 [drp] | Freq: Once | OPHTHALMIC | Status: AC
  Administered 2014-01-16: 2 [drp] via OPHTHALMIC
  Filled 2014-01-16: qty 2.5

## 2014-01-16 MED ORDER — AZITHROMYCIN 250 MG PO TABS
1000.0000 mg | ORAL_TABLET | Freq: Once | ORAL | Status: AC
  Administered 2014-01-16: 1000 mg via ORAL
  Filled 2014-01-16: qty 4

## 2014-01-16 MED ORDER — FLUORESCEIN SODIUM 1 MG OP STRP
1.0000 | ORAL_STRIP | Freq: Once | OPHTHALMIC | Status: AC
  Administered 2014-01-16: 1 via OPHTHALMIC
  Filled 2014-01-16: qty 1

## 2014-01-16 MED ORDER — ALBUTEROL SULFATE HFA 108 (90 BASE) MCG/ACT IN AERS
2.0000 | INHALATION_SPRAY | Freq: Once | RESPIRATORY_TRACT | Status: AC
  Administered 2014-01-16: 2 via RESPIRATORY_TRACT
  Filled 2014-01-16: qty 6.7

## 2014-01-16 MED ORDER — CIPROFLOXACIN HCL 0.3 % OP OINT: TOPICAL_OINTMENT | OPHTHALMIC | Status: DC

## 2014-01-16 MED ORDER — CEFTRIAXONE SODIUM 250 MG IJ SOLR
250.0000 mg | Freq: Once | INTRAMUSCULAR | Status: AC
  Administered 2014-01-16: 250 mg via INTRAMUSCULAR
  Filled 2014-01-16: qty 250

## 2014-01-16 NOTE — Discharge Instructions (Signed)
You were not tested for all STDs today. Your gonorrhea and chlamydia tests are pending- if they are positive, you will receive a phone call. Refrain from sex until you have the results from a full STD screen. Please go to [facility] (Address: [address], [place], [place] [address] Phone: [phone]) or see the [facility] (Address: [address] Phone: [phone]) for full STD screening. Return to the emergency room for worsening of symptoms, fever, and vomiting.  Do not take more than 3000 mg of ibuprofen per day  For pain control you may take:  800mg  of ibuprofen (that is usually 4 over the counter pills)  3 times a day (take with food) and acetaminophen 975mg  (this is 3 over the counter pills) four times a day. Do not drink alcohol or combine with other medications that have acetaminophen as an ingredient (Read the labels!).  For breakthrough pain you may take Tramadol. Do not drink alcohol drive or operate heavy machinery when taking Tramadol.   Follow with the eye doctor in the next 24 to 28 hours  Do not reuse your contact lenses and do not use any contact lenses until you are cleared by the eye doctor.  Apply the cipro antibiotic drops every 2 hours while you are awake for  2 days, then every 4 hours for 5 more days.    Wash your hands frequently and try to keep your hands away from the affected eye(s).   You should be feeling some improvement by 48 hours. If symptoms worsen, you develop pain, change in your vision or no improvement in 48 hours please follow with the ophthalmologist or, if that is not possible, return to the emergency room for a recheck.  Do not hesitate to return to the emergency room for any new, worsening or concerning symptoms.  Please obtain primary care using resource guide below. But the minute you were seen in the emergency room and that they will need to obtain records for further outpatient management.  [facility]  Emergency  Department Resource Guide 1) Find a Doctor and Pay Out of Pocket Although you won't have to find out who is covered by your insurance plan, it is a good idea to ask around and get recommendations. You will then need to call the office and see if the doctor you have chosen will accept you as a new patient and what types of options they offer for patients who are self-pay. Some doctors offer discounts or will set up payment plans for their patients who do not have insurance, but you will need to ask so you aren't surprised when you get to your appointment.  2) Contact Your Local Health Department Not all health departments have doctors that can see patients for sick visits, but many do, so it is worth a call to see if yours does. If you don't know where your local health department is, you can check in your phone book. The CDC also has a tool to help you locate your state's health department, and many state websites also have listings of all of their local health departments.  3) Find a [facility] If your illness is not likely to be very severe or complicated, you may want to try a walk in clinic. These are popping up all over the country in pharmacies, drugstores, and shopping centers. They're usually staffed by nurse practitioners or physician assistants that have been trained to treat common illnesses and complaints. They're usually fairly quick  and inexpensive. However, if you have serious medical issues or chronic medical problems, these are probably not your best option.  No Primary Care Doctor: - Call Health Connect at  [phone] - they can help you locate a primary care doctor that  accepts your insurance, provides certain services, etc. - Physician Referral Service- [phone]  Chronic Pain Problems: Organization         Address  Phone   Notes  [facility]  [phone] Patients need to be referred by their primary care doctor.   Medication  Assistance: Organization         Address  Phone   Notes  [facility] Medication [facility] [address] [place], [place] [address] [phone] --Must be a resident of [facility] -- Must have NO insurance coverage whatsoever (no Medicaid/ Medicare, etc.) -- The pt. MUST have a primary care doctor that directs their care regularly and follows them in the community   [facility]  [phone]   [employer]  [phone]    Agencies that provide inexpensive medical care: Organization         Address  Phone   Notes  [place]  [phone]   [facility]    [phone]   [facility] [place], [place] [address] [phone]   [facility] [address] [place] [phone]   [facility]anned Parenthood    [phone]   [facility]    [phone]   [place] and [place] [address], [place] Phone:  [phone], Fax:  [phone] Hours of Operation:  9 am - 6 pm, M-F.  Also accepts Medicaid/Medicare and self-pay.  [facility] for [place] [address], [address], [place] Phone: [phone], Fax: [phone]. Hours of Operation:  8:30 am - 5:30 pm, M-F.  Also accepts Medicaid and self-pay.  [facility] [place] [address], [place] Phone: [phone]   [place], [place], [place] [phone], Ext. [phone] Mondays & Thursdays: 7-9 AM.  First 15 patients are seen on a first come, first serve basis.    [place] Providers:  Organization         Address  Phone   Notes  [facility] [address], [place] [phone] Also accepts self-pay patients.  [facility] [address], [place]  [phone]   [place], [address], [place]  [phone]   [facility] [address] [place] [phone]   Lucianne Lei [address], [place]   [phone] Only accepts [place] Access [place] patients after they have their name applied to their card.   Self-Pay (no insurance) in [facility]:  Organization         Address  Phone   Notes  Sickle Cell Patients, [facility] [place] [phone]   [facility] [place] [phone]   [facility] [place]  [place], [place], [place] [phone]   [facility]/Dr. Osei-Bonsu  [address] [place] or [place], [address], [place] [phone] Phone number for  both [place] and [place] locations is the same.  Urgent Medical and [facility] [address], [place] [phone]   [facility] [address], [place] or [address] [phone]   [facility] [address], [place] [phone], phone; [phone], fax Sees patients 1st and 3rd Saturday of every month.  Must not qualify for public or private insurance (i.e. Medicaid, Medicare, [place] Health Choice, Veterans' Benefits)  Household income should be no more than 200% of the poverty level The clinic cannot treat you if you are pregnant or think you are pregnant  Sexually transmitted diseases are not treated at the clinic.    Dental Care: Organization         Address  Phone  Notes  [facility] [place] [phone] Accepts children up to age 51 who are enrolled in [place] or [place]; pregnant women with a Medicaid card; and children who have applied for Medicaid or [place] Health Choice, but were declined, whose parents can pay a reduced fee at time of service.  [facility]  Department of [facility]  [address], [place] [phone] Accepts children up to age 92 who are enrolled in [place] or [place]; pregnant women with a Medicaid card; and children who have applied for Medicaid or [place] Health Choice, but were declined, whose parents can pay a reduced fee at time of service.  [place] Adult Dental Access PROGRAM  [place] [phone] Patients are seen by appointment only. Walk-ins are not accepted. [place] will see patients 59 years of age and older. Monday - Tuesday (8am-5pm) Most Wednesdays (8:30-5pm) $30 per visit, cash only  [facility] Adult Dental Access PROGRAM  [address], [facility] [phone] Patients are seen by appointment only. Walk-ins are not accepted. [place] will see patients 58 years of age and older. One Wednesday Evening (Monthly: Volunteer Based).  $30 per visit, cash only  [place]  [phone] for adults; Children under age 25, call Graduate Pediatric Dentistry at [phone]. Children aged 62-14, please call [phone] to request a pediatric application.  Dental services are provided in all areas of dental care including fillings, crowns and bridges, complete and partial dentures, implants, gum treatment, root canals, and extractions. Preventive care is also provided. Treatment is provided to both adults and children. Patients are selected via a lottery and there is often a waiting list.   [facility] [address] [place]  [phone] [email or web address]   [facility] [address] [place], [place] [phone], Ext. [phone] Second and Fourth Thursday of each month, opens at 6:30 AM; Clinic ends at 9 AM.  Patients are seen on a first-come first-served basis, and a limited number are seen during each clinic.   [facility]  [address] Hillard Danker [place], [place] [phone]    Eligibility Requirements You must have lived in [place], [place], or [place] counties for at least the last three months.   You cannot be eligible for state or federal sponsored [employer], including [employer], [place], or [employer].   You generally cannot be eligible for healthcare insurance through your employer.    How to apply: Eligibility screenings are held every Tuesday and Wednesday afternoon from 1:00 pm until 4:00  pm. You do not need an appointment for the interview!  [facility] [address] [place], [place]   [place]  [place] Department  [place]  [phone]    Behavioral Health Resources in the Community: Intensive Outpatient Programs Organization         Address  Phone  Notes  Blakeslee Morris. [address] [place], [place] [phone]   [facility] [address] [place], [place]   ADS: Alcohol & Drug Svcs [address], [place], [place]   [place] [address]  [place], [place] or [phone]   Substance Abuse Resources Organization         Address  Phone  Notes  Alcohol and Drug Services  [phone]   [place]  [phone]   The [place]   Chinita Pester  [phone]   Residential & Outpatient Substance Abuse Program  [phone]   Psychological Services Organization         Address  Phone  Notes  [facility] [place]  [place]  [phone]   [employer] [address], [place] or [phone]    Mobile Crisis Teams Organization         Address  Phone  Notes  [facility], Mobile Crisis Care Unit  [phone]   [facility]ive Psychotherapeutic Services  [address]  [place], [place]   [place] Levels [address] [place] [phone]    Self-Help/Support Groups Organization         Address  Phone             Notes  [place]. of [place] - variety of support groups  [place] Call for more information  Narcotics Anonymous (NA), [facility] [address], [employer] [place]  2 meetings at this location   Special educational needs teacher         Address  Phone  Notes  ASAP Residential Treatment [place],    [place]  [phone]   [facility]  [address], [place] [address], [place], [place]   [place] [place], [place] [phone] Admissions: 8am-3pm M-F  Incentives Substance [place] [address]    [place], [place] [phone]   [facility] [address] [place], [place], [place]   [facility] [address],  [place], [place]   [facility] Programs - Intensive Outpatient [address], [place], [place]   [facility] ([place].,  [place], [place] [phone] or [phone]   [facility]idential Treatment Services (RTS) [address], [place], [place] Accepts Medicaid  [facility] [place] [address],  [place] [phone] Substance Abuse/Addiction Treatment   [facility] Organization         Address  Phone  Notes  [facility]  [phone]   Domenic Schwab, PhD [address] [place], [place]   [phone] or [phone]   [place], [place] [phone]   [place] [address], [place], [place] [phone] Insurance/Medicaid/sponsorship through [employer] and Families [address] [place] [address]  [address] [place], [place] [phone] [place], [place] [phone]    Dr. Adele Schilder  [phone]   [facility] Dept. 1) [address] [place] 2) [place] 3)  [place] [address], [place] [phone]  [phone]   [place] [phone] or [phone] (After Hours)

## 2014-01-16 NOTE — ED Notes (Signed)
Pt c/o penile discharge and burning x 3 days and eye redness and irritation

## 2014-01-16 NOTE — ED Provider Notes (Signed)
CSN: [id number]     Arrival date & time 01/16/14  1608 History  This chart was scribed for non-physician practitioner working with Orlie Dakin, MD by Molli Posey, ED Scribe. This patient was seen in room TR06C/TR06C and the patient's care was started at 5:07 PM.    Chief Complaint  Patient presents with  . Penile Discharge  . Conjunctivitis   HPI HPI Comments: RAMSES KLECKA is a 49 y.o. male with a history of asthma and COPD who presents to the Emergency Department complaining of penile discharge for the last 3 days. Pt reports associated burning with urination. Pt reports recent unprotected sex several times in the last few weeks. He denies any groin rash, testicular pain or swelling and abdominal pain. He reports NKDA.   He also complains of bilateral beye redness and irritation for the last 2 days. He states he wears contacts. He is unsure if there is any discharge from his eyes but says he has been messing with them frequently. Pt says he is worried his penile discharge may be causing his eye irritation.   Pt also complains of right arm numbness for the last 3 months and states that he experiences a sharp and shooting pain when he lifts his arm above his head. He reports he has dislocated his right elbow in June or July of 2015. Pt reports he has been taking 800mg  of ibuprofen every 4 hours which has failed to relieve his pain. He reports associated numbness on the lateral edge of the right shoulder.   Pt also wants a prescription for a rescue inhaler for his asthma and states his albuterol inhaler does not provide him relief.   Past Medical History  Diagnosis Date  . Back pain   . Asthma   . COPD (chronic obstructive pulmonary disease)   . Emphysema lung    History reviewed. No pertinent past surgical history. History reviewed. No pertinent family history. History  Substance Use Topics  . Smoking status: Current Every Day Smoker  . Smokeless tobacco: Not on file  .  Alcohol Use: Yes     Comment: occasional    Review of Systems  A complete 10 system review of systems was obtained and all systems are negative except as noted in the HPI and PMH.    Allergies  Review of patient's allergies indicates no known allergies.  Home Medications   Prior to Admission medications   Medication Sig Start Date End Date Taking? Authorizing Provider  albuterol (PROVENTIL HFA;VENTOLIN HFA) 108 (90 BASE) MCG/ACT inhaler Inhale 2-4 puffs into the lungs every 6 (six) hours as needed for wheezing or shortness of breath.     Historical Provider, MD  HYDROcodone-acetaminophen (NORCO/VICODIN) 5-325 MG per tablet Take 2 tablets by mouth every 4 (four) hours as needed. 10/17/13   Dot Lanes, MD  ibuprofen (ADVIL,MOTRIN) 200 MG tablet Take 800 mg by mouth every 6 (six) hours as needed for moderate pain.    Historical Provider, MD  Multiple Vitamin (MULTIVITAMIN WITH MINERALS) TABS tablet Take 1 tablet by mouth daily.    Historical Provider, MD   BP 117/86 mmHg  Pulse 81  Temp(Src) 97.9 F (36.6 C) (Oral)  Resp 18  SpO2 98% Physical Exam  Constitutional: He is oriented to person, place, and time. He appears well-developed and well-nourished.  HENT:  Head: Normocephalic and atraumatic.  Mouth/Throat: Oropharynx is clear and moist.  Eyes: Pupils are equal, round, and reactive to light. Right eye exhibits no discharge.  Left eye exhibits no discharge.  Slight swelling and erythema to bilateral upper and lower lids, no discharge. Trace bilateral conjunctival injection. Pupils are equal round and reactive to light, no photophobia. Small corneal abrasion outside the visual axis seen on floor seen stain to the left eye.   Neck: Normal range of motion. Neck supple. No tracheal deviation present.  Cardiovascular: Normal rate, regular rhythm and intact distal pulses.   Pulmonary/Chest: Effort normal and breath sounds normal. No respiratory distress. He has no wheezes. He has no  rales. He exhibits no tenderness.  Abdominal: Soft. Bowel sounds are normal. He exhibits no distension and no mass. There is no tenderness. There is no rebound and no guarding.  Genitourinary:  No significant urethral discharge, no testicular swelling or tenderness to palpation.  Musculoskeletal: Normal range of motion.  Right shoulder with full active range of motion, sensation is grossly intact to pinprick and light touch, he is distally neurovascularly intact  Neurological: He is alert and oriented to person, place, and time.  Skin: Skin is warm and dry.  Psychiatric: He has a normal mood and affect. His behavior is normal.  Nursing note and vitals reviewed.   ED Course  Procedures  DIAGNOSTIC STUDIES: Oxygen Saturation is 98% on RA, normal by my interpretation.    COORDINATION OF CARE: 5:13 PM Discussed treatment plan with pt at bedside and pt agreed to plan.   Labs Review Labs Reviewed - No data to display  Imaging Review No results found.   EKG Interpretation None      MDM   Final diagnoses:  Urethritis  Abrasion, corneal, left, initial encounter  Medication refill  Left shoulder pain  Screen for STD (sexually transmitted disease)  Accidental ibuprofen overdose, initial encounter    Filed Vitals:   01/16/14 1618 01/16/14 1835 01/16/14 1839  BP: 117/86  132/77  Pulse: 81  84  Temp: 97.9 F (36.6 C)  98.4 F (36.9 C)  TempSrc: Oral  Oral  Resp: 18  18  SpO2: 98% 97% 98%    Medications  tetracaine (PONTOCAINE) 0.5 % ophthalmic solution 2 drop (2 drops Right Eye Given 01/16/14 1759)  fluorescein ophthalmic strip 1 strip (1 strip Both Eyes Given 01/16/14 1759)  cefTRIAXone (ROCEPHIN) injection 250 mg (250 mg Intramuscular Given 01/16/14 1733)  azithromycin (ZITHROMAX) tablet 1,000 mg (1,000 mg Oral Given 01/16/14 1733)  lidocaine (PF) (XYLOCAINE) 1 % injection 5 mL (5 mLs Other Given 01/16/14 1733)  ciprofloxacin (CILOXAN) 0.3 % ophthalmic solution 2 drop (2  drops Both Eyes Given 01/16/14 1817)  albuterol (PROVENTIL HFA;VENTOLIN HFA) 108 (90 BASE) MCG/ACT inhaler 2 puff (2 puffs Inhalation Given 01/16/14 1817)    DEMARQUEZ CIOLEK is a pleasant 49 y.o. male presenting with urethra this onset 3 days ago and bilateral eye irritation onset 2 days ago. No significant discharge, no significant injection to the eye. Patient is rubbing his eyes profusely, there is a small corneal abrasion. Patient is a contact lens wearer I have advised him to DC contact lens use and throw them out. Patient will be treated for urethritis with Rocephin and azithromycin. I do not think he has a true gonococcal conjunctivitis. I've advised patient to follow closely with ophthalmology.  Patient is having impingement symptoms to the right shoulder with decreased sensation and sharp pain when he lifts the arm above degrees. He states taking ibuprofen 800 mg every 4 hours for pain that he has to the left arm. I-STAT Chem-8 shows no elevation in  his creatinine. Advised him that this is unsafe and could result in permanent kidney damage and dialysis.  Patient is requesting refill of albuterol inhaler.  I discussed findings and offered treatment for GC/Clamydia today in the ED. I explained to Pt that results of GC/Chlamidia testing are pending and that they may come back negative. Discussed pros and cons of treatment. Pt opted for treatment today. Pt will be given 250mg  of rocephin IM and 1g Azithromycin PO.   Evaluation does not show pathology that would require ongoing emergent intervention or inpatient treatment. Pt is hemodynamically stable and mentating appropriately. Discussed findings and plan with patient/guardian, who agrees with care plan. All questions answered. Return precautions discussed and outpatient follow up given.   Discharge Medication List as of 01/16/2014  6:35 PM    START taking these medications   Details  ciprofloxacin (CILOXAN) 0.3 % ophthalmic ointment 1-2 drops  in affected eye every 2 hours while awake for 2 days then every 4 hours while awake for 5 days., Print    traMADol (ULTRAM) 50 MG tablet Take 1 tablet (50 mg total) by mouth every 6 (six) hours as needed., Starting 01/16/2014, Until Discontinued, Print         I personally performed the services described in this documentation, which was scribed in my presence. The recorded information has been reviewed and is accurate.       Monico Blitz, PA-C 01/16/14 2320  Orlie Dakin, MD 01/17/14 [phone]

## 2014-01-17 LAB — HIV ANTIBODY (ROUTINE TESTING W REFLEX)
HIV 1/O/2 Abs-Index Value: <1 (ref <1.00)
HIV-1/HIV-2 Ab: NONREACTIVE

## 2014-01-17 LAB — GC/CHLAMYDIA PROBE AMP (~~LOC~~) NOT AT ARMC
Chlamydia: NEGATIVE
Neisseria Gonorrhea: NEGATIVE

## 2014-01-17 LAB — RPR: RPR Ser Ql: NONREACTIVE

## 2014-05-30 ENCOUNTER — Encounter (HOSPITAL_COMMUNITY): Payer: Self-pay | Admitting: Emergency Medicine

## 2014-05-30 ENCOUNTER — Emergency Department (HOSPITAL_COMMUNITY)
Admission: EM | Admit: 2014-05-30 | Discharge: 2014-05-30 | Disposition: A | Discharge status: Home / Self Care | Attending: Emergency Medicine | Admitting: Emergency Medicine

## 2014-05-30 DIAGNOSIS — N508 Other specified disorders of male genital organs: Secondary | ICD-10-CM | POA: Insufficient documentation

## 2014-05-30 DIAGNOSIS — R369 Urethral discharge, unspecified: Secondary | ICD-10-CM | POA: Insufficient documentation

## 2014-05-30 DIAGNOSIS — Z720 Tobacco use: Secondary | ICD-10-CM | POA: Insufficient documentation

## 2014-05-30 DIAGNOSIS — Z792 Long term (current) use of antibiotics: Secondary | ICD-10-CM | POA: Insufficient documentation

## 2014-05-30 DIAGNOSIS — J449 Chronic obstructive pulmonary disease, unspecified: Secondary | ICD-10-CM | POA: Insufficient documentation

## 2014-05-30 DIAGNOSIS — Z7251 High risk heterosexual behavior: Secondary | ICD-10-CM

## 2014-05-30 DIAGNOSIS — R300 Dysuria: Secondary | ICD-10-CM | POA: Insufficient documentation

## 2014-05-30 DIAGNOSIS — R590 Localized enlarged lymph nodes: Secondary | ICD-10-CM | POA: Insufficient documentation

## 2014-05-30 LAB — URINE MICROSCOPIC-ADD ON

## 2014-05-30 LAB — HIV ANTIBODY (ROUTINE TESTING W REFLEX): HIV Screen 4th Generation wRfx: NONREACTIVE

## 2014-05-30 LAB — RPR: RPR: NONREACTIVE

## 2014-05-30 LAB — URINALYSIS, ROUTINE W REFLEX MICROSCOPIC
Bilirubin Urine: NEGATIVE
Glucose, UA: NEGATIVE mg/dL
Hgb urine dipstick: NEGATIVE
KETONES UR: NEGATIVE mg/dL
Nitrite: NEGATIVE
PROTEIN: NEGATIVE mg/dL
Specific Gravity, Urine: 1.022 (ref 1.005–1.030)
UROBILINOGEN UA: 1 mg/dL (ref 0.0–1.0)
pH: 5.5 (ref 5.0–8.0)

## 2014-05-30 MED ORDER — ALBUTEROL SULFATE HFA 108 (90 BASE) MCG/ACT IN AERS
2.0000 | INHALATION_SPRAY | Freq: Once | RESPIRATORY_TRACT | Status: AC
  Administered 2014-05-30: 2 via RESPIRATORY_TRACT
  Filled 2014-05-30: qty 6.7

## 2014-05-30 MED ORDER — CEFTRIAXONE SODIUM 250 MG IJ SOLR
250.0000 mg | Freq: Once | INTRAMUSCULAR | Status: AC
  Administered 2014-05-30: 250 mg via INTRAMUSCULAR
  Filled 2014-05-30: qty 250

## 2014-05-30 MED ORDER — AZITHROMYCIN 250 MG PO TABS
1000.0000 mg | ORAL_TABLET | Freq: Once | ORAL | Status: AC
  Administered 2014-05-30: 1000 mg via ORAL
  Filled 2014-05-30: qty 4

## 2014-05-30 NOTE — ED Notes (Signed)
Pt. requesting STD screening reports penile discharge  and mild dysuria onset this week , denies fever .

## 2014-05-30 NOTE — Discharge Instructions (Signed)
You have been tested today for gonorrhea, chlamydia, syphilis, herpes, and trichomonas. Your treatment today has included that for gonorrhea and chlamydia. Your trichomonas test is negative. The remainder of your tests are pending. Follow-up with the health department in 48 hours to obtain the results of your STD tests. Do not engage in sexual intercourse for one week from today as you have been treated for gonorrhea and chlamydia and engaging in sexual intercourse prior to 06/06/14 may increase your risk of reinfection. If you require additional STD treatment, you must refrain from sexual intercourse for an additional week. Notify all sexual partners of the need to be tested and treated for STDs. Your sexual partners should be 1 week out from treatment before, also, engaging in sexual intercourse. Return to the emergency department as needed if symptoms worsen.  Sexually Transmitted Disease A sexually transmitted disease (STD) is a disease or infection that may be passed (transmitted) from person to person, usually during sexual activity. This may happen by way of saliva, semen, blood, vaginal mucus, or urine. Common STDs include:   Gonorrhea.   Chlamydia.   Syphilis.   HIV and AIDS.   Genital herpes.   Hepatitis B and C.   Trichomonas.   Human papillomavirus (HPV).   Pubic lice.   Scabies.  Mites.  Bacterial vaginosis. WHAT ARE CAUSES OF STDs? An STD may be caused by bacteria, a virus, or parasites. STDs are often transmitted during sexual activity if one person is infected. However, they may also be transmitted through nonsexual means. STDs may be transmitted after:   Sexual intercourse with an infected person.   Sharing sex toys with an infected person.   Sharing needles with an infected person or using unclean piercing or tattoo needles.  Having intimate contact with the genitals, mouth, or rectal areas of an infected person.   Exposure to infected fluids during  birth. WHAT ARE THE SIGNS AND SYMPTOMS OF STDs? Different STDs have different symptoms. Some people may not have any symptoms. If symptoms are present, they may include:   Painful or bloody urination.   Pain in the pelvis, abdomen, vagina, anus, throat, or eyes.   A skin rash, itching, or irritation.  Growths, ulcerations, blisters, or sores in the genital and anal areas.  Abnormal vaginal discharge with or without bad odor.   Penile discharge in men.   Fever.   Pain or bleeding during sexual intercourse.   Swollen glands in the groin area.   Yellow skin and eyes (jaundice). This is seen with hepatitis.   Swollen testicles.  Infertility.  Sores and blisters in the mouth. HOW ARE STDs DIAGNOSED? To make a diagnosis, your health care provider may:   Take a medical history.   Perform a physical exam.   Take a sample of any discharge to examine.  Swab the throat, cervix, opening to the penis, rectum, or vagina for testing.  Test a sample of your first morning urine.   Perform blood tests.   Perform a Pap test, if this applies.   Perform a colposcopy.   Perform a laparoscopy.  HOW ARE STDs TREATED? Treatment depends on the STD. Some STDs may be treated but not cured.   Chlamydia, gonorrhea, trichomonas, and syphilis can be cured with antibiotic medicine.   Genital herpes, hepatitis, and HIV can be treated, but not cured, with prescribed medicines. The medicines lessen symptoms.   Genital warts from HPV can be treated with medicine or by freezing, burning (electrocautery), or surgery.  Warts may come back.   HPV cannot be cured with medicine or surgery. However, abnormal areas may be removed from the cervix, vagina, or vulva.   If your diagnosis is confirmed, your recent sexual partners need treatment. This is true even if they are symptom-free or have a negative culture or evaluation. They should not have sex until their health care providers  say it is okay. HOW CAN I REDUCE MY RISK OF GETTING AN STD? Take these steps to reduce your risk of getting an STD:  Use latex condoms, dental dams, and water-soluble lubricants during sexual activity. Do not use petroleum jelly or oils.  Avoid having multiple sex partners.  Do not have sex with someone who has other sex partners.  Do not have sex with anyone you do not know or who is at high risk for an STD.  Avoid risky sex practices that can break your skin.  Do not have sex if you have open sores on your mouth or skin.  Avoid drinking too much alcohol or taking illegal drugs. Alcohol and drugs can affect your judgment and put you in a vulnerable position.  Avoid engaging in oral and anal sex acts.  Get vaccinated for HPV and hepatitis. If you have not received these vaccines in the past, talk to your health care provider about whether one or both might be right for you.   If you are at risk of being infected with HIV, it is recommended that you take a prescription medicine daily to prevent HIV infection. This is called pre-exposure prophylaxis (PrEP). You are considered at risk if:  You are a man who has sex with other men (MSM).  You are a heterosexual man or woman and are sexually active with more than one partner.  You take drugs by injection.  You are sexually active with a partner who has HIV.  Talk with your health care provider about whether you are at high risk of being infected with HIV. If you choose to begin PrEP, you should first be tested for HIV. You should then be tested every 3 months for as long as you are taking PrEP.  WHAT SHOULD I DO IF I THINK I HAVE AN STD?  See your health care provider.   Tell your sexual partner(s). They should be tested and treated for any STDs.  Do not have sex until your health care provider says it is okay. WHEN SHOULD I GET IMMEDIATE MEDICAL CARE? Contact your health care provider right away if:   You have severe  abdominal pain.  You are a man and notice swelling or pain in your testicles.  You are a woman and notice swelling or pain in your vagina. Document Released: 03/13/2002 Document Revised: 12/26/2012 Document Reviewed: 07/11/2012 [facility] Patient Information 2015 [place], [place]. This information is not intended to replace advice given to you by your health care provider. Make sure you discuss any questions you have with your health care provider.  Safe Sex Safe sex is about reducing the risk of giving or getting a sexually transmitted disease (STD). STDs are spread through sexual contact involving the genitals, mouth, or rectum. Some STDs can be cured and others cannot. Safe sex can also prevent unintended pregnancies.  WHAT ARE SOME SAFE SEX PRACTICES?  Limit your sexual activity to only one partner who is having sex with only you.  Talk to your partner about his or her past partners, past STDs, and drug use.  Use a condom every  time you have sexual intercourse. This includes vaginal, oral, and anal sexual activity. Both females and males should wear condoms during oral sex. Only use latex or polyurethane condoms and water-based lubricants. Using petroleum-based lubricants or oils to lubricate a condom will weaken the condom and increase the chance that it will break. The condom should be in place from the beginning to the end of sexual activity. Wearing a condom reduces, but does not completely eliminate, your risk of getting or giving an STD. STDs can be spread by contact with infected body fluids and skin.  Get vaccinated for hepatitis B and HPV.  Avoid alcohol and recreational drugs, which can affect your judgment. You may forget to use a condom or participate in high-risk sex.  For females, avoid douching after sexual intercourse. Douching can spread an infection farther into the reproductive tract.  Check your body for signs of sores, blisters, rashes, or unusual discharge. See your  health care provider if you notice any of these signs.  Avoid sexual contact if you have symptoms of an infection or are being treated for an STD. If you or your partner has herpes, avoid sexual contact when blisters are present. Use condoms at all other times.  If you are at risk of being infected with HIV, it is recommended that you take a prescription medicine daily to prevent HIV infection. This is called pre-exposure prophylaxis (PrEP). You are considered at risk if:  You are a man who has sex with other men (MSM).  You are a heterosexual man or woman who is sexually active with more than one partner.  You take drugs by injection.  You are sexually active with a partner who has HIV.  Talk with your health care provider about whether you are at high risk of being infected with HIV. If you choose to begin PrEP, you should first be tested for HIV. You should then be tested every 3 months for as long as you are taking PrEP.  See your health care provider for regular screenings, exams, and tests for other STDs. Before having sex with a new partner, each of you should be screened for STDs and should talk about the results with each other. WHAT ARE THE BENEFITS OF SAFE SEX?   There is less chance of getting or giving an STD.  You can prevent unwanted or unintended pregnancies.  By discussing safe sex concerns with your partner, you may increase feelings of intimacy, comfort, trust, and honesty between the two of you. Document Released: 01/29/2004 Document Revised: 05/07/2013 Document Reviewed: 06/14/2011 [facility] Patient Information 2015 [place], [place]. This information is not intended to replace advice given to you by your health care provider. Make sure you discuss any questions you have with your health care provider.

## 2014-05-30 NOTE — ED Notes (Signed)
Reviewed discharge instructions with patient, voiced understanding

## 2014-05-30 NOTE — ED Provider Notes (Signed)
CSN: [id number]     Arrival date & time 05/30/14  0135 History   First MD Initiated Contact with Patient 05/30/14 0252     Chief Complaint  Patient presents with  . Penile Discharge    (Consider location/radiation/quality/duration/timing/severity/associated sxs/prior Treatment) HPI Comments: Patient is a 49 year old male with a history of back pain, asthma, and COPD. He presents to the emergency department for further evaluation of penile discharge. Patient states that he and his wife split up approximately 18 months ago. He states that he has been sexually active with multiple partners since this time. He reports engaging in oral sex, anal sex, and vaginal intercourse. He denies consistent use of condoms. Patient sexually active with both male and male partners. Patient describes greenish discharge in the last 4 days. He states that he experienced some testicular tenderness yesterday, but has not had any of this today. Symptoms also associated with dysuria 1 week. Patient denies fever as well as vomiting. No abdominal pain. He is requesting testing for all STDs. Patient, as an aside, states that he ran out of his albuterol inhaler which he uses for his asthma exacerbations.  Patient is a 49 y.o. male presenting with penile discharge. The history is provided by the patient. No language interpreter was used.  Penile Discharge Pertinent negatives include no abdominal pain, fever, nausea or vomiting.    Past Medical History  Diagnosis Date  . Back pain   . Asthma   . COPD (chronic obstructive pulmonary disease)   . Emphysema lung    History reviewed. No pertinent past surgical history. No family history on file. History  Substance Use Topics  . Smoking status: Current Every Day Smoker  . Smokeless tobacco: Not on file  . Alcohol Use: Yes     Comment: occasional    Review of Systems  Constitutional: Negative for fever.  Gastrointestinal: Negative for nausea, vomiting and abdominal  pain.  Genitourinary: Positive for dysuria, discharge, penile pain and testicular pain. Negative for hematuria, penile swelling and scrotal swelling.  Neurological: Negative for syncope.  All other systems reviewed and are negative.   Allergies  Review of patient's allergies indicates no known allergies.  Home Medications   Prior to Admission medications   Medication Sig Start Date End Date Taking? Authorizing Provider  albuterol (PROVENTIL HFA;VENTOLIN HFA) 108 (90 BASE) MCG/ACT inhaler Inhale 2-4 puffs into the lungs every 6 (six) hours as needed for wheezing or shortness of breath.     Historical Provider, MD  ciprofloxacin (CILOXAN) 0.3 % ophthalmic ointment 1-2 drops in affected eye every 2 hours while awake for 2 days then every 4 hours while awake for 5 days. 01/16/14   Nicole Pisciotta, PA-C  HYDROcodone-acetaminophen (NORCO/VICODIN) 5-325 MG per tablet Take 2 tablets by mouth every 4 (four) hours as needed. 10/17/13   Leonard Schwartz, MD  ibuprofen (ADVIL,MOTRIN) 200 MG tablet Take 800 mg by mouth every 6 (six) hours as needed for moderate pain.    Historical Provider, MD  Multiple Vitamin (MULTIVITAMIN WITH MINERALS) TABS tablet Take 1 tablet by mouth daily.    Historical Provider, MD  traMADol (ULTRAM) 50 MG tablet Take 1 tablet (50 mg total) by mouth every 6 (six) hours as needed. 01/16/14   Nicole Pisciotta, PA-C   BP 134/81 mmHg  Pulse 89  Temp(Src) 98.4 F (36.9 C) (Oral)  Resp 18  Wt 178 lb (80.74 kg)  SpO2 96%   Physical Exam  Constitutional: He is oriented to person, place, and time.  He appears well-developed and well-nourished. No distress.  HENT:  Head: Normocephalic and atraumatic.  Eyes: Conjunctivae and EOM are normal. No scleral icterus.  Neck: Normal range of motion.  Cardiovascular: Normal rate, regular rhythm and intact distal pulses.   Pulmonary/Chest: Effort normal and breath sounds normal. No respiratory distress. He has no wheezes. He has no rales.   Respirations even and unlabored  Abdominal: Soft. He exhibits no distension. There is no tenderness. Hernia confirmed negative in the right inguinal area and confirmed negative in the left inguinal area.  Soft, nontender  Genitourinary: Right testis shows no mass, no swelling and no tenderness. Right testis is descended. Left testis shows no mass, no swelling and no tenderness. Left testis is descended. Circumcised. Discharge found.  Chaperoned GU exam notable for mild discharge at the urethral meatus.  Musculoskeletal: Normal range of motion.  Lymphadenopathy:       Right: Inguinal (mild) adenopathy present.       Left: Inguinal adenopathy present.  Neurological: He is alert and oriented to person, place, and time. He exhibits normal muscle tone. Coordination normal.  Skin: Skin is warm and dry. No rash noted. He is not diaphoretic. No erythema. No pallor.  Psychiatric: He has a normal mood and affect. His behavior is normal.  Nursing note and vitals reviewed.   ED Course  Procedures (including critical care time) Labs Review Labs Reviewed  URINALYSIS, ROUTINE W REFLEX MICROSCOPIC (NOT AT [facility]) - Abnormal; Notable for the following:    APPearance CLOUDY (*)    Leukocytes, UA MODERATE (*)    All other components within normal limits  URINE MICROSCOPIC-ADD ON  RPR  HIV ANTIBODY (ROUTINE TESTING)  HSV 1 ANTIBODY, IGG  HSV 2 ANTIBODY, IGG  GC/CHLAMYDIA PROBE AMP ([place]) NOT AT [facility]  GC/CHLAMYDIA PROBE AMP ([place]) NOT AT [facility]    Imaging Review No results found.   EKG Interpretation None      MDM   Final diagnoses:  Penile discharge  History of unprotected sex    Patient to be discharged with instructions to follow up with [facility] Department. Discussed importance of using protection when sexually active. Pt understands that they have GC/Chlamydia, Syphilis, and HIV cultures pending and that they will need to inform all sexual partners if results return  positive. Patient has been treated prophylacticly with azithromycin and rocephin due to his history, GU exam, and UA with increased WBCs. Return precautions discussed and provided. Patient agreeable to plan with no unaddressed concerns. Patient discharged in good condition; VSS.   Filed Vitals:   05/30/14 0138 05/30/14 0247  BP: 139/97 134/81  Pulse: 101 89  Temp: 98.4 F (36.9 C)   TempSrc: Oral   Resp: 16 18  Weight: 178 lb (80.74 kg)   SpO2: 95% 96%      Antonietta Breach, PA-C 05/30/14 [place], MD 05/30/14 [id number]

## 2014-05-31 LAB — HSV 2 ANTIBODY, IGG

## 2014-05-31 LAB — GC/CHLAMYDIA PROBE AMP (~~LOC~~) NOT AT ARMC
CHLAMYDIA, DNA PROBE: NEGATIVE
NEISSERIA GONORRHEA: POSITIVE — AB

## 2014-05-31 LAB — HSV 1 ANTIBODY, IGG: HSV 1 GLYCOPROTEIN G AB, IGG: 40 {index} — AB (ref 0.00–0.90)

## 2014-06-01 ENCOUNTER — Telehealth (HOSPITAL_BASED_OUTPATIENT_CLINIC_OR_DEPARTMENT_OTHER): Payer: Self-pay | Admitting: Emergency Medicine

## 2014-06-01 NOTE — Telephone Encounter (Signed)
Spoke with patient and notified + GC, did receive treatment, educated to abstain from sex for 2 weeks and notify any sexual partner(s)

## 2014-07-16 ENCOUNTER — Emergency Department (HOSPITAL_COMMUNITY)

## 2014-07-16 ENCOUNTER — Emergency Department (HOSPITAL_COMMUNITY)
Admission: EM | Admit: 2014-07-16 | Discharge: 2014-07-16 | Discharge status: Against Medical Advice | Attending: Emergency Medicine | Admitting: Emergency Medicine

## 2014-07-16 ENCOUNTER — Encounter (HOSPITAL_COMMUNITY): Payer: Self-pay | Admitting: *Deleted

## 2014-07-16 ENCOUNTER — Emergency Department (HOSPITAL_COMMUNITY): Payer: Self-pay

## 2014-07-16 ENCOUNTER — Observation Stay (HOSPITAL_COMMUNITY): Admission: EM | Admit: 2014-07-16 | Payer: Self-pay | Source: Intra-hospital | Admitting: Psychiatry

## 2014-07-16 DIAGNOSIS — F1210 Cannabis abuse, uncomplicated: Secondary | ICD-10-CM | POA: Insufficient documentation

## 2014-07-16 DIAGNOSIS — Z79899 Other long term (current) drug therapy: Secondary | ICD-10-CM | POA: Insufficient documentation

## 2014-07-16 DIAGNOSIS — Z720 Tobacco use: Secondary | ICD-10-CM | POA: Insufficient documentation

## 2014-07-16 DIAGNOSIS — R4182 Altered mental status, unspecified: Secondary | ICD-10-CM

## 2014-07-16 DIAGNOSIS — J449 Chronic obstructive pulmonary disease, unspecified: Secondary | ICD-10-CM | POA: Insufficient documentation

## 2014-07-16 DIAGNOSIS — F1510 Other stimulant abuse, uncomplicated: Secondary | ICD-10-CM | POA: Insufficient documentation

## 2014-07-16 DIAGNOSIS — F22 Delusional disorders: Secondary | ICD-10-CM | POA: Insufficient documentation

## 2014-07-16 LAB — COMPREHENSIVE METABOLIC PANEL
ALBUMIN: 3.6 g/dL (ref 3.5–5.0)
ALK PHOS: 56 U/L (ref 38–126)
ALT: 15 U/L — AB (ref 17–63)
AST: 17 U/L (ref 15–41)
Anion gap: 8 (ref 5–15)
BUN: 12 mg/dL (ref 6–20)
CALCIUM: 9 mg/dL (ref 8.9–10.3)
CO2: 26 mmol/L (ref 22–32)
Chloride: 104 mmol/L (ref 101–111)
Creatinine, Ser: 1.21 mg/dL (ref 0.61–1.24)
GFR calc Af Amer: >60 mL/min (ref >60)
Glucose, Bld: 98 mg/dL (ref 65–99)
Potassium: 3.3 mmol/L — ABNORMAL LOW (ref 3.5–5.1)
SODIUM: 138 mmol/L (ref 135–145)
Total Bilirubin: 0.7 mg/dL (ref 0.3–1.2)
Total Protein: 6.1 g/dL — ABNORMAL LOW (ref 6.5–8.1)

## 2014-07-16 LAB — CBC WITH DIFFERENTIAL/PLATELET
BASOS ABS: 0 10*3/uL (ref 0.0–0.1)
Basophils Relative: 1 % (ref 0–1)
EOS PCT: 2 % (ref 0–5)
Eosinophils Absolute: 0.1 10*3/uL (ref 0.0–0.7)
HCT: 37.8 % — ABNORMAL LOW (ref 39.0–52.0)
Hemoglobin: 12.6 g/dL — ABNORMAL LOW (ref 13.0–17.0)
LYMPHS ABS: 2.4 10*3/uL (ref 0.7–4.0)
Lymphocytes Relative: 32 % (ref 12–46)
MCH: 29.6 pg (ref 26.0–34.0)
MCHC: 33.3 g/dL (ref 30.0–36.0)
MCV: 88.7 fL (ref 78.0–100.0)
MONO ABS: 0.7 10*3/uL (ref 0.1–1.0)
Monocytes Relative: 10 % (ref 3–12)
NEUTROS ABS: 4.1 10*3/uL (ref 1.7–7.7)
Neutrophils Relative %: 55 % (ref 43–77)
Platelets: 218 10*3/uL (ref 150–400)
RBC: 4.26 MIL/uL (ref 4.22–5.81)
RDW: 13.3 % (ref 11.5–15.5)
WBC: 7.4 10*3/uL (ref 4.0–10.5)

## 2014-07-16 LAB — URINE MICROSCOPIC-ADD ON

## 2014-07-16 LAB — URINALYSIS, ROUTINE W REFLEX MICROSCOPIC
Glucose, UA: NEGATIVE mg/dL
Hgb urine dipstick: NEGATIVE
KETONES UR: 15 mg/dL — AB
Nitrite: NEGATIVE
Protein, ur: NEGATIVE mg/dL
Specific Gravity, Urine: 1.025 (ref 1.005–1.030)
Urobilinogen, UA: 1 mg/dL (ref 0.0–1.0)
pH: 6 (ref 5.0–8.0)

## 2014-07-16 LAB — RAPID HIV SCREEN (HIV 1/2 AB+AG)
HIV 1/2 Antibodies: NONREACTIVE
HIV-1 P24 Antigen - HIV24: NONREACTIVE

## 2014-07-16 LAB — RAPID URINE DRUG SCREEN, HOSP PERFORMED
AMPHETAMINES: POSITIVE — AB
BENZODIAZEPINES: NOT DETECTED
Barbiturates: NOT DETECTED
COCAINE: NOT DETECTED
Opiates: NOT DETECTED
TETRAHYDROCANNABINOL: POSITIVE — AB

## 2014-07-16 LAB — ETHANOL

## 2014-07-16 MED ORDER — ZOLPIDEM TARTRATE 5 MG PO TABS: 5.0000 mg | ORAL_TABLET | Freq: Every evening | ORAL | Status: DC | PRN

## 2014-07-16 MED ORDER — IBUPROFEN 400 MG PO TABS: 600.0000 mg | ORAL_TABLET | Freq: Three times a day (TID) | ORAL | Status: DC | PRN

## 2014-07-16 MED ORDER — LORAZEPAM 1 MG PO TABS: 1.0000 mg | ORAL_TABLET | Freq: Three times a day (TID) | ORAL | Status: DC | PRN

## 2014-07-16 MED ORDER — ALBUTEROL SULFATE HFA 108 (90 BASE) MCG/ACT IN AERS: 2.0000 | INHALATION_SPRAY | Freq: Four times a day (QID) | RESPIRATORY_TRACT | Status: DC | PRN

## 2014-07-16 MED ORDER — ACETAMINOPHEN 325 MG PO TABS: 650.0000 mg | ORAL_TABLET | ORAL | Status: DC | PRN

## 2014-07-16 MED ORDER — ALUM & MAG HYDROXIDE-SIMETH 200-200-20 MG/5ML PO SUSP: 30.0000 mL | ORAL | Status: DC | PRN

## 2014-07-16 MED ORDER — POTASSIUM CHLORIDE CRYS ER 20 MEQ PO TBCR
40.0000 meq | EXTENDED_RELEASE_TABLET | Freq: Once | ORAL | Status: AC
  Administered 2014-07-16: 40 meq via ORAL
  Filled 2014-07-16: qty 2

## 2014-07-16 MED ORDER — NICOTINE 14 MG/24HR TD PT24
14.0000 mg | MEDICATED_PATCH | Freq: Every day | TRANSDERMAL | Status: DC
  Filled 2014-07-16: qty 1

## 2014-07-16 MED ORDER — ONDANSETRON HCL 4 MG PO TABS: 4.0000 mg | ORAL_TABLET | Freq: Three times a day (TID) | ORAL | Status: DC | PRN

## 2014-07-16 MED ORDER — ADULT MULTIVITAMIN W/MINERALS CH: 1.0000 | ORAL_TABLET | Freq: Every day | ORAL | Status: DC

## 2014-07-16 NOTE — ED Provider Notes (Signed)
CSN: [id number]     Arrival date & time 07/16/14  1549 History   First MD Initiated Contact with Patient 07/16/14 1607     Chief Complaint  Patient presents with  . Paranoid  . Altered Mental Status     (Consider location/radiation/quality/duration/timing/severity/associated sxs/prior Treatment) Patient is a 49 y.o. male presenting with altered mental status. The history is provided by the EMS personnel and the patient. The history is limited by the condition of the patient (Altered mental status).  Altered Mental Status He was brought to the emergency department because of altered mental status. EMS was called because he was standing outside of the clinic for 10 hours he lives just across the street. He is expressing paranoid ideation stating that he did smoke some math yesterday but thinks that somebody put something in its a poison him. He also talks about someone who has been staying in the house and planting drug paraphernalia around the house. He expresses that this person somehow is trying to hurt him and his sister. He expresses how he tried to send his sister a text message warning her but that she had not received it. He denies any other recent drug use and denies any recent alcohol use although he has used alcohol in the past. He does admit to smoking 1 pack of cigarettes a day.  Past Medical History  Diagnosis Date  . Back pain   . Asthma   . COPD (chronic obstructive pulmonary disease)   . Emphysema lung    History reviewed. No pertinent past surgical history. No family history on file. History  Substance Use Topics  . Smoking status: Current Every Day Smoker  . Smokeless tobacco: Not on file  . Alcohol Use: Yes     Comment: occasional    Review of Systems  Unable to perform ROS: Mental status change      Allergies  Review of patient's allergies indicates no known allergies.  Home Medications   Prior to Admission medications   Medication Sig Start Date End  Date Taking? Authorizing Provider  albuterol (PROVENTIL HFA;VENTOLIN HFA) 108 (90 BASE) MCG/ACT inhaler Inhale 2-4 puffs into the lungs every 6 (six) hours as needed for wheezing or shortness of breath.     Historical Provider, MD  ciprofloxacin (CILOXAN) 0.3 % ophthalmic ointment 1-2 drops in affected eye every 2 hours while awake for 2 days then every 4 hours while awake for 5 days. 01/16/14   Nicole Pisciotta, PA-C  HYDROcodone-acetaminophen (NORCO/VICODIN) 5-325 MG per tablet Take 2 tablets by mouth every 4 (four) hours as needed. 10/17/13   Leonard Schwartz, MD  ibuprofen (ADVIL,MOTRIN) 200 MG tablet Take 800 mg by mouth every 6 (six) hours as needed for moderate pain.    Historical Provider, MD  Multiple Vitamin (MULTIVITAMIN WITH MINERALS) TABS tablet Take 1 tablet by mouth daily.    Historical Provider, MD  traMADol (ULTRAM) 50 MG tablet Take 1 tablet (50 mg total) by mouth every 6 (six) hours as needed. 01/16/14   Nicole Pisciotta, PA-C   BP 128/100 mmHg  Pulse 65  Temp(Src) 98.3 F (36.8 C) (Oral)  Resp 18  SpO2 98% Physical Exam  Nursing note and vitals reviewed.  49 year old male, resting comfortably and in no acute distress. Vital signs are significant for diastolic hypertension. Oxygen saturation is 98%, which is normal. Head is normocephalic and atraumatic. PERRLA, EOMI. Oropharynx is clear. Neck is nontender and supple without adenopathy or JVD. Back is nontender and there  is no CVA tenderness. Lungs are clear without rales, wheezes, or rhonchi. Chest is nontender. Heart has regular rate and rhythm without murmur. Abdomen is soft, flat, nontender without masses or hepatosplenomegaly and peristalsis is normoactive. Extremities have no cyanosis or edema, full range of motion is present. Skin is warm and dry without rash. Neurologic: He is awake and alert and speech is spontaneous and coherent although somewhat rambling with some flight of ideas and paranoid ideation, cranial  nerves are intact, there are no motor or sensory deficits.  ED Course  Procedures (including critical care time) Labs Review Results for orders placed or performed during the hospital encounter of 07/16/14  Comprehensive metabolic panel  Result Value Ref Range   Sodium 138 135 - 145 mmol/L   Potassium 3.3 (L) 3.5 - 5.1 mmol/L   Chloride 104 101 - 111 mmol/L   CO2 26 22 - 32 mmol/L   Glucose, Bld 98 65 - 99 mg/dL   BUN 12 6 - 20 mg/dL   Creatinine, Ser 1.21 0.61 - 1.24 mg/dL   Calcium 9.0 8.9 - 10.3 mg/dL   Total Protein 6.1 (L) 6.5 - 8.1 g/dL   Albumin 3.6 3.5 - 5.0 g/dL   AST 17 15 - 41 U/L   ALT 15 (L) 17 - 63 U/L   Alkaline Phosphatase 56 38 - 126 U/L   Total Bilirubin 0.7 0.3 - 1.2 mg/dL   GFR calc non Af Amer >60 >60 mL/min   GFR calc Af Amer >60 >60 mL/min   Anion gap 8 5 - 15  Ethanol  Result Value Ref Range   Alcohol, Ethyl (B) <5 <5 mg/dL  CBC with Differential  Result Value Ref Range   WBC 7.4 4.0 - 10.5 K/uL   RBC 4.26 4.22 - 5.81 MIL/uL   Hemoglobin 12.6 (L) 13.0 - 17.0 g/dL   HCT 37.8 (L) 39.0 - 52.0 %   MCV 88.7 78.0 - 100.0 fL   MCH 29.6 26.0 - 34.0 pg   MCHC 33.3 30.0 - 36.0 g/dL   RDW 13.3 11.5 - 15.5 %   Platelets 218 150 - 400 K/uL   Neutrophils Relative % 55 43 - 77 %   Neutro Abs 4.1 1.7 - 7.7 K/uL   Lymphocytes Relative 32 12 - 46 %   Lymphs Abs 2.4 0.7 - 4.0 K/uL   Monocytes Relative 10 3 - 12 %   Monocytes Absolute 0.7 0.1 - 1.0 K/uL   Eosinophils Relative 2 0 - 5 %   Eosinophils Absolute 0.1 0.0 - 0.7 K/uL   Basophils Relative 1 0 - 1 %   Basophils Absolute 0.0 0.0 - 0.1 K/uL  Urinalysis, Routine w reflex microscopic (not at [facility])  Result Value Ref Range   Color, Urine AMBER (A) YELLOW   APPearance CLEAR CLEAR   Specific Gravity, Urine 1.025 1.005 - 1.030   pH 6.0 5.0 - 8.0   Glucose, UA NEGATIVE NEGATIVE mg/dL   Hgb urine dipstick NEGATIVE NEGATIVE   Bilirubin Urine SMALL (A) NEGATIVE   Ketones, ur 15 (A) NEGATIVE mg/dL   Protein,  ur NEGATIVE NEGATIVE mg/dL   Urobilinogen, UA 1.0 0.0 - 1.0 mg/dL   Nitrite NEGATIVE NEGATIVE   Leukocytes, UA TRACE (A) NEGATIVE  Urine rapid drug screen (hosp performed)  Result Value Ref Range   Opiates NONE DETECTED NONE DETECTED   Cocaine NONE DETECTED NONE DETECTED   Benzodiazepines NONE DETECTED NONE DETECTED   Amphetamines POSITIVE (A) NONE DETECTED  Tetrahydrocannabinol POSITIVE (A) NONE DETECTED   Barbiturates NONE DETECTED NONE DETECTED  Rapid HIV screen (HIV 1/2 Ab+Ag)  Result Value Ref Range   HIV-1 P24 Antigen - HIV24 NON REACTIVE NON REACTIVE   HIV 1/2 Antibodies NON REACTIVE NON REACTIVE   Interpretation (HIV Ag Ab)      A non reactive test result means that HIV 1 or HIV 2 antibodies and HIV 1 p24 antigen were not detected in the specimen.  Urine microscopic-add on  Result Value Ref Range   Squamous Epithelial / LPF RARE RARE   WBC, UA 0-2 <3 WBC/hpf   Bacteria, UA RARE RARE   Urine-Other MUCOUS PRESENT    Imaging Review Dg Chest 2 View  07/16/2014   CLINICAL DATA:  49 year old male with shortness of breath and altered mental status  EXAM: CHEST  2 VIEW  COMPARISON:  Chest radiograph dated 09/23/2013  FINDINGS: Two views of the chest demonstrate emphysematous changes of the lungs. There is no focal consolidation, pleural effusion, or pneumothorax. The cardiomediastinal silhouette is within normal limits. The osseous structures are grossly unremarkable.  IMPRESSION: No active cardiopulmonary disease.   Electronically Signed   By: Anner Crete M.D.   On: 07/16/2014 17:21   Ct Head Wo Contrast  07/16/2014   CLINICAL DATA:  49 year old male with altered mental status all new  EXAM: CT HEAD WITHOUT CONTRAST  TECHNIQUE: Contiguous axial images were obtained from the base of the skull through the vertex without intravenous contrast.  COMPARISON:  None.  FINDINGS: The ventricles and the sulci are appropriate in size for the patient's age. There is no intracranial  hemorrhage. No midline shift or mass effect identified. The gray-white matter differentiation is preserved.  Mild mucoperiosteal thickening of paranasal sinuses with partial opacification of the ethmoid air cells. The visualized mastoid air cells are clear. The calvarium is intact.  IMPRESSION: No acute intracranial pathology.   Electronically Signed   By: Anner Crete M.D.   On: 07/16/2014 17:36     MDM   Final diagnoses:  Altered mental status  Paranoid ideation    Mental status change with paranoid ideation. Review of past records shows no ED visits for psychiatric problems but multiple times in for STD screening. He does request HIV screening today because he states that someone was trying to intentionally infect him. It is noted on past records that he has had negative RPR. Screening workup is initiated including CT scan and chest x-ray as well as drug screen. At this point, this does seem to be 7. The psychiatric problem although it is unusual for psychosis to have onset at this age.  TTS consult is appreciated. They're recommending that he stay for evaluation by psychiatrist in the morning. Patient is absolutely refusing to stay. His aunt is with him and is unable to convince him to stay. In spite of this, he still adamantly denies any homicidal or suicidal ideation. He does not meet criteria for involuntary commitment. I have attempted to reason with him and bargain with him to have him stay for psychiatric evaluation but have been unsuccessful. He is discharged [address] but is advised to return if he changes his mind. He is especially told that if he starts having thoughts of hurting himself or other people that he should return immediately.  Delora Fuel, MD 29/79/89 [id number]

## 2014-07-16 NOTE — ED Notes (Signed)
Patient transported to X-ray 

## 2014-07-16 NOTE — ED Notes (Signed)
TTS in process at this time  

## 2014-07-16 NOTE — ED Notes (Signed)
Pt presents via [facility] for altered mental status.  EMS was called from [facility] after standing outside for 10 hours although pt lives across the street.  O2 at minute clinic was 90% RA, lungs clear.  Pt not answering questions appropriately, begins to speak spanish when asked.  Pt speaking of a "drug dealer" that is threatening to hurt him and his family.  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  Pt with track marks on BL UE.  BP-126/70 P-90 O2-94% 4L but pt refused to keep O2 on b/c he thought EMS staff were giving him "gas".  Pt refusing to let staff take vitals or treat him until he sees "a familiar face" or "[employer]". Pt with no psych hx. Pt NAD on arrival.

## 2014-07-16 NOTE — ED Notes (Signed)
PT BELONGINGS RETURNED TO PT AT DISCHARGE; PT LEFT WITH CELL PHONE AND CHARGER IN HAND; PT STATES TO FAMILY AT BEDSIDE THAT KEYS AN WALLET WAS WITH OTHER FAMILY MEMBER.

## 2014-07-16 NOTE — ED Provider Notes (Signed)
D/w Murriel Hopper of TTS - states needs reeval in the AM as he is still having some "talking out of his head".  They will reeval in the AM.  Noemi Chapel, MD 07/16/14 [phone]

## 2014-07-16 NOTE — ED Notes (Signed)
Pt changed into scrubs and belongings in belonging bag.

## 2014-07-16 NOTE — Discharge Instructions (Signed)
I have tried to set up an evaluation by a psychiatrist, but she will refuse to stay for that evaluation. I am concerned that you need treatment for the thoughts that you are having. If you change your mind about letting this psychiatrist evaluates you here, return immediately. Otherwise, call the psychiatry office in the morning for an appointment to be seen as soon as possible. If at any point, you start having thoughts of hurting herself or other people, return to the emergency department immediately.  Paranoia Paranoia is a distrust of others that is not based on a real reason for distrust. This may reach delusional levels. This means the paranoid person feels the world is against them when there is no reason to make them feel that way. People with paranoia feel as though people around them are "out to get them".  SIMILAR MENTAL ILNESSES  Depression is a feeling as though you are down all the time. It is normal in some situations where you have just lost a loved one. It is abnormal if you are having feelings of paranoia with it.  Dementia is a physical problem with the brain in which the brain no longer works properly. There are problems with daily activities of living. Alzheimer's disease is one example of this. Dementia is also caused by old age changes in the brain which come with the death of brain cells and small strokes.  Paranoidschizophrenia. People with paranoid schizophrenia and persecutory delusional disorder have delusions in which they feel people around them are plotting against them. Persecutory delusions in paranoid schizophrenia are bizarre, sometimes grandiose, and often accompanied by auditory hallucinations. This means the person is hearing voices that are not there.  Delusionaldisorder (persecutory type). Delusions experienced by individuals with delusional disorder are more believable than those experienced by paranoid schizophrenics; they are not bizarre, though still  unjustified. Individuals with delusional disorder may seem offbeat or quirky rather than mentally ill, and therefore, may never seek treatment. All of these problems usually do not allow these people to interact socially in an acceptable manner. CAUSES The cause of paranoia is often not known. It is common in people with extended abuse of:  Cocaine.  Amphetamine.  Marijuana.  Alcohol. Sometimes there is an inherited tendency. It may be associated with stress or changes in brain chemistry. DIAGNOSIS  When paranoia is present, your caregiver may:  Refer you to a specialist.  Do a physical exam.  Perform other tests on you to make sure there are not other problems causing the paranoia including:  Physical problems.  Mental problems.  Chemical problems (other than drugs). Testing may be done to determine if there is a psychiatric disability present that can be treated with medicine. TREATMENT   Paranoia that is a symptom of a psychiatric problem should be treated by professionals.  Medicines are available which can help this disorder. Antipsychotic medicine may be prescribed by your caregiver.  Sometimes psychotherapy may be useful.  Conditions such as depression or drug abuse are treated individually. If the paranoia is caused by drug abuse, a treatment facility may be helpful. Depression may be helped by antidepressants. PROGNOSIS   Paranoid people are difficult to treat because of their belief that everyone is out to get them or harm them. Because of this mistrust, they often must be talked into entering treatment by a trusted family member or friend. They may not want to take medicine as they may see this as an attempt to poison them.  Gradual  gains in the trust of a therapist or caregiver helps in a successful treatment plan.  Some people with PPD or persecutory delusional disorder function in society without treatment in limited fashion. Document Released: 12/24/2002  Document Revised: 03/15/2011 Document Reviewed: 08/29/2007 [facility] Patient Information 2015 [place], [place]. This information is not intended to replace advice given to you by your health care provider. Make sure you discuss any questions you have with your health care provider.  Discharge Against Medical Advice I am signing this paper to show that I am leaving this hospital or health care center of my own free will. It is done against all medical advice. In doing so, I am releasing this hospital or health care center and the attending physicians from any and all claims that I may want to make. I understand that further care has been recommended. My condition may worsen. This could cause me further bodily injury, illness, or even death. I do know that the medical staff has fully explained to me the risk that I am taking in leaving against medical advice. Document Released: 12/21/2004 Document Revised: 03/15/2011 Document Reviewed: 06/07/2006 [facility] Patient Information 2015 [place], [place]. This information is not intended to replace advice given to you by your health care provider. Make sure you discuss any questions you have with your health care provider.

## 2014-07-16 NOTE — ED Notes (Signed)
Pt has decided to leave AMA. 

## 2014-07-16 NOTE — BH Assessment (Signed)
Assessment Note  Andres Harding is an 49 y.o. male with history of polysubstance abuse. He present to [facility] for medical clearance. Per nursing reports, "[facility] was [facility] after standing outside for 10 hours although pt lives across the street. [facility]2 at [facility] was 90% RA, lungs clear. Pt not answering questions appropriately, begins to speak spanish when asked. Pt speaking of a "drug dealer" that is threatening to hurt him and his family. Pt reports using heroin and crystal meth today today".   Writer completed a TTS assessment on patient. He denies SI, HI, and AVH's. Patient has no associated history. Patient is calm and cooperative. During the assessment he would speak of someone "Phillips Odor". Patient explains that this is a acquaintance.Patient rambles about this person and also speaks of drug dealers. Writer unsure if patient is speaking of factual information. Patient is alert. Patient has not desire to obtain help from his substance abuse. Sts that he has never been hospitalized for inpatient psychiatric treatment and/or mental health issues.   Patient has a significant history of substance abuse. Please see Additional Social History for details related to patient's substance use. Discussed case with Reginold Agent, NP and she recommended overnight observation. Patient to be re-evaluated by psychiatry in the morning.   Writer discussed patient's disposition with nursing staff-Monique. Also contacted EDP-Dr. Sabra Heck to update about patient's disposition. Dr. Sabra Heck suggested the observation unit. AC-Tori confirmed that the observation unit is open. Patriciaann Clan accepted patient to the observation unit. Dr. Sabra Heck again updated regarding patient's disposition. Patient's nurse will be notified of patient's bed assignment.   Axis I: Substance Induced Mood Disorder  Axis II: Deferred Axis III:  Past Medical History  Diagnosis Date  . Back pain   . Asthma   . COPD (chronic  obstructive pulmonary disease)   . Emphysema lung    Axis IV: other psychosocial or environmental problems, problems related to social environment, problems with access to health care services and problems with primary support group Axis V: 31-40 impairment in reality testing  Past Medical History:  Past Medical History  Diagnosis Date  . Back pain   . Asthma   . COPD (chronic obstructive pulmonary disease)   . Emphysema lung     History reviewed. No pertinent past surgical history.  Family History: No family history on file.  Social History:  reports that he has been smoking.  He does not have any smokeless tobacco history on file. He reports that he drinks alcohol. He reports that he uses illicit drugs (Marijuana).  Additional Social History:  Alcohol / Drug Use Pain Medications: SEE MAR Prescriptions: SEE MAR Over the Counter: SEE MAR History of alcohol / drug use?: Yes Substance #1 Name of Substance 1: Crystal Meth  1 - Age of First Use: 85 1 - Amount (size/oz): "I don't know" 1 - Frequency: "I use every not and then" 1 - Duration: 6 months of use 1 - Last Use / Amount: 07/15/2014 Substance #2 Name of Substance 2: Xanax 2 - Age of First Use: 49 yrs old  2 - Amount (size/oz): varies 2 - Frequency: daily 2 - Duration: on-going  2 - Last Use / Amount: 07/15/2014 Substance #3 Name of Substance 3: THC 3 - Age of First Use: 49 yrs old  3 - Amount (size/oz): varies  3 - Frequency: "I seldomly use THC" 3 - Duration: on-going  3 - Last Use / Amount: 07/15/2014 Substance #4 Name of Substance 4: Liquid  G 4 - Age of First Use: 14 4 - Amount (size/oz): "I don't know" 4 - Frequency: "I have used 1-x's in thepast 6 months" 4 - Duration: 6 months  4 - Last Use / Amount: 07/16/2014 Substance #5 Name of Substance 5: cocaine  5 - Age of First Use: 49 yrs old  5 - Amount (size/oz): "I don't now" 5 - Frequency: on-going  5 - Duration: on-going  5 - Last Use / Amount: yesterday    CIWA: CIWA-Ar BP: (!) 104/48 mmHg Pulse Rate: 77 COWS:    Allergies: No Known Allergies  Home Medications:  (Not in a hospital admission)  OB/GYN Status:  No LMP for male patient.  General Assessment Data Location of Assessment: [facility] ED Is this a Tele or Face-to-Face Assessment?: Face-to-Face Is this an Initial Assessment or a Re-assessment for this encounter?: Initial Assessment Marital status: Single Maiden name:  (n/a) Is patient pregnant?: No Pregnancy Status: No Living Arrangements: Alone Can pt return to current living arrangement?: Yes Admission Status: Voluntary Is patient capable of signing voluntary admission?: Yes Referral Source: Self/Family/Friend Insurance type:  (Self Pay)     Crisis Care Plan Living Arrangements: Alone Name of Psychiatrist:  (No psychiatrist ) Name of Therapist:  (No therapist )  Education Status Is patient currently in school?: No Current Grade:  (n/a) Highest grade of school patient has completed:  (n/a) Name of school: n/a Contact person:  (n/a)  Risk to self with the past 6 months Suicidal Ideation: No Has patient been a risk to self within the past 6 months prior to admission? : No Suicidal Intent: No Has patient had any suicidal intent within the past 6 months prior to admission? : No Is patient at risk for suicide?: No Suicidal Plan?: No Has patient had any suicidal plan within the past 6 months prior to admission? : No Access to Means: No What has been your use of drugs/alcohol within the last 12 months?:  (n/a) Previous Attempts/Gestures: No How many times?: 0 Other Self Harm Risks:  (n/a) Triggers for Past Attempts:  (n/a) Intentional Self Injurious Behavior: None Family Suicide History: No Recent stressful life event(s): Other (Comment) (patient denies stressors) Persecutory voices/beliefs?: No Depression: No Depression Symptoms:  (pt denies synptoms ) Substance abuse history and/or treatment for substance  abuse?: No Suicide prevention information given to non-admitted patients: Not applicable  Risk to Others within the past 6 months Homicidal Ideation: No Does patient have any lifetime risk of violence toward others beyond the six months prior to admission? : No Thoughts of Harm to Others: No Current Homicidal Intent: No Current Homicidal Plan: No Access to Homicidal Means: No Identified Victim:  (n/a) History of harm to others?: No Assessment of Violence: None Noted Violent Behavior Description:  (patient calm and cooeperative ) Does patient have access to weapons?: No Criminal Charges Pending?: No Does patient have a court date: No Is patient on probation?: No  Psychosis Hallucinations: None noted Delusions: None noted  Mental Status Report Appearance/Hygiene: Disheveled Eye Contact: Fair Motor Activity: Freedom of movement Speech: Logical/coherent Level of Consciousness: Alert Mood: Depressed Affect: Appropriate to circumstance Anxiety Level: None Thought Processes: Coherent, Relevant Judgement: Impaired Orientation: Person, Place, Situation, Time Obsessive Compulsive Thoughts/Behaviors: Minimal  Cognitive Functioning Concentration: Decreased Memory: Recent Intact, Remote Intact IQ: Average Insight: Fair Impulse Control: Fair Appetite: Poor Weight Loss:  (none reported) Weight Gain:  (none reported) Sleep: No Change Vegetative Symptoms: None  ADLScreening [facility] Assessment Services) Patient's cognitive ability adequate  to safely complete daily activities?: Yes Patient able to express need for assistance with ADLs?: Yes Independently performs ADLs?: Yes (appropriate for developmental age)  Prior Inpatient Therapy Prior Inpatient Therapy: No Prior Therapy Dates:  (n/a) Prior Therapy Facilty/Provider(s):  (n/a) Reason for Treatment:  (n/a)  Prior Outpatient Therapy Prior Outpatient Therapy: No Prior Therapy Dates:  (n/a) Prior Therapy Facilty/Provider(s):   (n/a) Reason for Treatment:  (n/a) Does patient have an ACCT team?: No Does patient have Intensive In-House Services?  : No Does patient have [facility] services? : No Does patient have P4CC services?: No  ADL Screening (condition at time of admission) Patient's cognitive ability adequate to safely complete daily activities?: Yes Is the patient deaf or have difficulty hearing?: No Does the patient have difficulty concentrating, remembering, or making decisions?: Yes Patient able to express need for assistance with ADLs?: Yes Does the patient have difficulty dressing or bathing?: No Independently performs ADLs?: Yes (appropriate for developmental age) Does the patient have difficulty walking or climbing stairs?: No Weakness of Legs: None Weakness of Arms/Hands: None  Home Assistive Devices/Equipment Home Assistive Devices/Equipment: None    Abuse/Neglect Assessment (Assessment to be complete while patient is alone) Physical Abuse: Denies Verbal Abuse: Denies Sexual Abuse: Denies Exploitation of patient/patient's resources: Denies Self-Neglect: Denies Values / Beliefs Cultural Requests During Hospitalization: None Spiritual Requests During Hospitalization: None   Advance Directives (For Healthcare) Does patient have an advance directive?: No Would patient like information on creating an advanced directive?: No - patient declined information          Disposition:  Disposition Initial Assessment Completed for this Encounter: Yes Disposition of Patient: Inpatient treatment program (Patient to be observed overnight and re-evaluated in the am) Patient referred to:  (overnight observation and re-evalute in the morning, per [facility])  On Site Evaluation by:   Reviewed with Physician:    Waldon Merl [facility] 07/16/2014 7:46 PM

## 2014-07-16 NOTE — ED Notes (Signed)
Pt was advised of plan to admit overnight for observation; Pt did not agree with plan; Roxanne Mins, [place] notified and came to speak with pt

## 2014-09-01 ENCOUNTER — Emergency Department (HOSPITAL_COMMUNITY): Payer: Self-pay

## 2014-09-01 ENCOUNTER — Emergency Department (HOSPITAL_COMMUNITY)
Admission: EM | Admit: 2014-09-01 | Discharge: 2014-09-01 | Disposition: A | Discharge status: Home / Self Care | Payer: Self-pay | Attending: Emergency Medicine | Admitting: Emergency Medicine

## 2014-09-01 ENCOUNTER — Other Ambulatory Visit: Payer: Self-pay | Admitting: Emergency Medicine

## 2014-09-01 DIAGNOSIS — Z79899 Other long term (current) drug therapy: Secondary | ICD-10-CM | POA: Insufficient documentation

## 2014-09-01 DIAGNOSIS — J449 Chronic obstructive pulmonary disease, unspecified: Secondary | ICD-10-CM | POA: Insufficient documentation

## 2014-09-01 DIAGNOSIS — L03116 Cellulitis of left lower limb: Secondary | ICD-10-CM | POA: Insufficient documentation

## 2014-09-01 DIAGNOSIS — Z720 Tobacco use: Secondary | ICD-10-CM | POA: Insufficient documentation

## 2014-09-01 HISTORY — DX: Unspecified asthma, uncomplicated: J45.909

## 2014-09-01 HISTORY — DX: Chronic obstructive pulmonary disease, unspecified: J44.9

## 2014-09-01 LAB — CBC
HEMATOCRIT: 36.8 % — AB (ref 39.0–52.0)
Hemoglobin: 12.3 g/dL — ABNORMAL LOW (ref 13.0–17.0)
MCH: 29.8 pg (ref 26.0–34.0)
MCHC: 33.4 g/dL (ref 30.0–36.0)
MCV: 89.1 fL (ref 78.0–100.0)
PLATELETS: 248 10*3/uL (ref 150–400)
RBC: 4.13 MIL/uL — ABNORMAL LOW (ref 4.22–5.81)
RDW: 13.1 % (ref 11.5–15.5)
WBC: 10.5 10*3/uL (ref 4.0–10.5)

## 2014-09-01 LAB — BASIC METABOLIC PANEL
Anion gap: 8 (ref 5–15)
BUN: 17 mg/dL (ref 6–20)
CALCIUM: 8.9 mg/dL (ref 8.9–10.3)
CO2: 25 mmol/L (ref 22–32)
CREATININE: 1.22 mg/dL (ref 0.61–1.24)
Chloride: 102 mmol/L (ref 101–111)
GFR calc Af Amer: >60 mL/min (ref >60)
GLUCOSE: 92 mg/dL (ref 65–99)
Potassium: 3.3 mmol/L — ABNORMAL LOW (ref 3.5–5.1)
Sodium: 135 mmol/L (ref 135–145)

## 2014-09-01 MED ORDER — CLINDAMYCIN HCL 300 MG PO CAPS
300.0000 mg | ORAL_CAPSULE | Freq: Once | ORAL | Status: AC
  Administered 2014-09-01: 300 mg via ORAL
  Filled 2014-09-01: qty 1

## 2014-09-01 MED ORDER — ALBUTEROL SULFATE HFA 108 (90 BASE) MCG/ACT IN AERS
2.0000 | INHALATION_SPRAY | Freq: Four times a day (QID) | RESPIRATORY_TRACT | Status: DC | PRN
  Administered 2014-09-01: 2 via RESPIRATORY_TRACT
  Filled 2014-09-01: qty 6.7

## 2014-09-01 MED ORDER — CLINDAMYCIN HCL 150 MG PO CAPS: 300.0000 mg | ORAL_CAPSULE | Freq: Four times a day (QID) | ORAL | Status: DC

## 2014-09-01 NOTE — ED Notes (Signed)
Patient aware of need for urine specimen.

## 2014-09-01 NOTE — ED Provider Notes (Signed)
CSN: [id number]     Arrival date & time 09/01/14  1527 History   First MD Initiated Contact with Patient 09/01/14 1725     Chief Complaint  Patient presents with  . Wound Infection     (Consider location/radiation/quality/duration/timing/severity/associated sxs/prior Treatment) HPI Comments: 49 year old male with COPD who presents with left leg wound. 2 days ago, the patient was mowing his lawn when a rock struck the lateral part of his left lower leg. It bled for a Martell Mcfadyen while and then stopped. Since then, he has noticed worsening redness and swelling of his leg. The wound started weeping today. He reports increased pain on his leg with the redness and swelling. He has also noted several other sores on his body including one on his penis. The wounds are not itchy but he has picked at them significantly. Patient is concerned about STDs as he has had a lot of unprotected sex recently. He denies any associated fevers, weight loss, night sweats, vomiting, diarrhea, or blood in his stool. He does endorse at least twice weekly methamphetamine use.  The history is provided by the patient.    Past Medical History  Diagnosis Date  . Asthma   . COPD (chronic obstructive pulmonary disease)    History reviewed. No pertinent past surgical history. No family history on file. Social History  Substance Use Topics  . Smoking status: Current Every Day Smoker -- 1.00 packs/day    Types: Cigarettes  . Smokeless tobacco: None  . Alcohol Use: Yes    Review of Systems  10 Systems reviewed and are negative for acute change except as noted in the HPI.   Allergies  Review of patient's allergies indicates no known allergies.  Home Medications   Prior to Admission medications   Medication Sig Start Date End Date Taking? Authorizing Provider  albuterol (PROVENTIL HFA;VENTOLIN HFA) 108 (90 BASE) MCG/ACT inhaler Inhale 2 puffs into the lungs every 6 (six) hours as needed for wheezing or shortness of  breath.   Yes Historical Provider, MD  ibuprofen (ADVIL,MOTRIN) 200 MG tablet Take 400 mg by mouth every 6 (six) hours as needed.   Yes Historical Provider, MD  Soft Lens Products (SALINE SENSITIVE EYES) SOLN Place 1 drop into both eyes 3 (three) times daily as needed (for dry eyes).   Yes Historical Provider, MD  albuterol (PROVENTIL) (2.5 MG/3ML) 0.083% nebulizer solution Take 2.5 mg by nebulization every 6 (six) hours as needed for wheezing or shortness of breath.   Yes Historical Provider, MD  clindamycin (CLEOCIN) 150 MG capsule Take 2 capsules (300 mg total) by mouth 4 (four) times daily. 09/01/14   Wenda Overland Srihari Shellhammer, MD   BP 131/79 mmHg  Pulse 94  Temp(Src) 98.7 F (37.1 C) (Oral)  Resp 22  Ht 6\' 7"  (2.007 m)  Wt 180 lb (81.647 kg)  BMI 20.27 kg/m2  SpO2 100% Physical Exam  Constitutional: He is oriented to person, place, and time. No distress.  Thin-appearing man  HENT:  Head: Normocephalic and atraumatic.  Mouth/Throat: Oropharynx is clear and moist. No oropharyngeal exudate.  Moist mucous membranes  Eyes: Conjunctivae are normal. Pupils are equal, round, and reactive to light.  Neck: Neck supple.  Cardiovascular: Normal rate, regular rhythm and normal heart sounds.   No murmur heard. Pulmonary/Chest: Effort normal. No respiratory distress. He has no rales.  Coarse breath sounds b/l bases with slightly diminished breath sounds  Abdominal: Soft. Bowel sounds are normal. He exhibits no distension. There is no tenderness.  Genitourinary:  Ulceration at base of penis near scrotum w/ no penile discharge or drainage  Musculoskeletal: He exhibits no edema.  Mild swelling L lower leg w/ minimal tenderness to palpation near wound; normal ROM L ankle  Neurological: He is alert and oriented to person, place, and time.  Fluent speech  Skin:  Ulceration on lateral L lower leg draining serous fluid w/ surrounding erythema and warmth involving lower leg from ankle to just below knee;  scattered scabs on arms, R ear, L earlobe, chin  Nursing note and vitals reviewed.  Chaperone was present during exam.  ED Course  Procedures (including critical care time) Labs Review Labs Reviewed  CBC - Abnormal; Notable for the following:    RBC 4.13 (*)    Hemoglobin 12.3 (*)    HCT 36.8 (*)    All other components within normal limits  BASIC METABOLIC PANEL - Abnormal; Notable for the following:    Potassium 3.3 (*)    All other components within normal limits  HIV ANTIBODY (ROUTINE TESTING)  RPR  GC/CHLAMYDIA PROBE AMP ([facility]) NOT AT [facility]    Imaging Review Dg Tibia/fibula Left  09/01/2014   CLINICAL DATA:  Struck in lower legs with rock while mowing a few days ago. Current cellulitis, assess for foreign body or osteomyelitis.  EXAM: LEFT TIBIA AND FIBULA - 2 VIEW  COMPARISON:  None.  FINDINGS: There is no evidence of fracture or other focal bone lesions. Partially calcified patellar tendon. Lateral LEFT lower extremity soft tissue swelling without subcutaneous gas or radiopaque foreign bodies.  IMPRESSION: Lateral leg soft tissue swelling without radiopaque foreign bodies or acute osseous process.   Electronically Signed   By: Elon Alas M.D.   On: 09/01/2014 19:00   I have personally reviewed and evaluated these  lab results as part of my medical decision-making.   EKG Interpretation None      MDM   Final diagnoses:  Left leg cellulitis   49 year old male who presents with 2 days of swelling and redness around a left leg wound that was caused by a rock striking his leg during lawnmowing. Patient also notes sores on penis and concerns for STD because of unprotected sex recently. At presentation, the patient was awake, alert, thin but in no acute distress. Vital signs notable for mild hypertension. Basic labs showed normal WBC count, mild anemia at 12.3 hemoglobin. Obtained plain films of left leg to rule out osteomyelitis or foreign body. Also sent STD  screening including rapid HIV, RPR, and GC/chlamydia.  Patient does endorse daily ibuprofen use and I have strongly recommended discontinuing all NSAIDs and using Tylenol only as he is at significant risk for PUD. He denies any black stools or what he stools. He has a normal heart rate and is ambulatory here which is reassuring against significant, rapid blood loss. Regarding concerns for STDs such as HIV, the patient denies any new cough, hemoptysis, or change in weight. He states that the lesion on his penis started as an ingrown hair and was not painful until he picked at it. 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 For his leg cellulitis, plain film does not show any foreign body or evidence of gas. Gave the patient a dose of clindamycin and provided a prescription for outpatient treatment. Emphasized the importance of compliance with medication and reviewed return precautions. Patient requested albuterol for his COPD and provided him with inhaler. Provided  patient with resources to follow up with PCP. Patient voiced understanding of plan and return precautions and was discharged in satisfactory condition.  Sharlett Iles, MD 09/02/14 [id number]

## 2014-09-01 NOTE — ED Notes (Signed)
Pt is here for infection on his leg which occurred after a rock hit his leg while mowing 2 days ago. Pt also has multiple other spots on his body with little sores and reports one sore on his penis and would like "full check".  Pt reports pain in left leg, area is visibly swollen

## 2014-09-01 NOTE — ED Notes (Signed)
MD at bedside. 

## 2014-09-01 NOTE — Discharge Instructions (Signed)
°Emergency Department Resource Guide °1) Find a Doctor and Pay Out of Pocket °Although you won't have to find out who is covered by your insurance plan, it is a good idea to ask around and get recommendations. You will then need to call the office and see if the doctor you have chosen will accept you as a new patient and what types of options they offer for patients who are self-pay. Some doctors offer discounts or will set up payment plans for their patients who do not have insurance, but you will need to ask so you aren't surprised when you get to your appointment. ° °2) Contact Your Local Health Department °Not all health departments have doctors that can see patients for sick visits, but many do, so it is worth a call to see if yours does. If you don't know where your local health department is, you can check in your phone book. The CDC also has a tool to help you locate your state's health department, and many state websites also have listings of all of their local health departments. ° °3) Find a Walk-in Clinic °If your illness is not likely to be very severe or complicated, you may want to try a walk in clinic. These are popping up all over the country in pharmacies, drugstores, and shopping centers. They're usually staffed by nurse practitioners or physician assistants that have been trained to treat common illnesses and complaints. They're usually fairly quick and inexpensive. However, if you have serious medical issues or chronic medical problems, these are probably not your best option. ° °No Primary Care Doctor: °- Call [facility] at  832-8000 - they can help you locate a primary care doctor that  accepts your insurance, provides certain services, etc. °- Physician Referral Service- 1-800-533-3463 ° °Chronic Pain Problems: °Organization         Address  Phone   Notes  °[facility]  (336) 297-2271 Patients need to be referred by their primary care doctor.  ° °Medication  Assistance: °Organization         Address  Phone   Notes  °[place] [facility] [address] °[place], [place] [address] (336) 641-8030 --Must be a resident of [place] °-- Must have NO insurance coverage whatsoever (no Medicaid/ Medicare, etc.) °-- The pt. MUST have a primary care doctor that directs their care regularly and follows them in the community °  °[facility]  (866) 331-1348   °[employer]  (888) 892-1162   ° °Agencies that provide inexpensive medical care: °Organization         Address  Phone   Notes  °[facility]  (336) 832-8035   °[facility]    (336) 832-7272   °[facility] [address] °[place], [place] [address] (336) 832-4777   °[facility] [address], °[place] (336) 271-4999   °[facility]    (336) 373-0678   °[facility]    (336) 272-1050   °[facility] ° [address], [place] Phone:  (336) 832-4444, Fax:  (336) 832-4440 Hours of Operation:  9 am - 6 pm, M-F.  Also accepts Medicaid/Medicare and self-pay.  °[facility] ° [address], [place] Phone: (336) 832-3150, Fax: (336) 832-3151. Hours of Operation:  8:30 am - 5:30 pm, M-F.  Also accepts Medicaid and self-pay.  °[facility] [place] [address], [place] Phone: (336) 878-6027   °[facility] [address], [place], [place] (336)723-1848, Ext. 123 Mondays & Thursdays: 7-9 AM.  First 15 patients are seen on a first come, first serve basis. °  ° °Medicaid-accepting [place] Providers: ° °Organization         Address  Phone   Notes  °[facility] [address], [place] (336) 641-2100 Also accepts self-pay patients.  °[facility] [address], [place] ° (336) 856-9996   °[facility] [address], [place]  (336) 288-8857   °[facility] [address] [place] [address], [place] (336) 299-7000   °Veita Bland [address], [place]  ° (336) 373-1557 Only accepts [place] Access Medicaid patients after they have their name applied to their card.  ° °Self-Pay (no insurance) in [place]: ° °Organization         Address  Phone   Notes  °Sickle Cell Patients, [facility] [address], [place] (336) 832-1970   °[facility] [address], [place] (336) 832-4400   ° [facility] ° [address], [place] (336) 992-4800   °[facility]/Dr. Osei-Bonsu ° [address] [place] [address], [place] or [address], [place] (336) 841-8500 Phone number for both [place] and [place] locations is the same.  °[facility] [address], [place] (336) 299-0000   °[facility] [address] [place] [address], [place] or [address] (336) 852-7530 °(336) 878-2260   °[facility] [address], [place] (336) 350-1642, phone; (336) 294-5005, fax Sees patients 1st and 3rd Saturday of every month.  Must not qualify for public or private insurance (i.e. Medicaid, Medicare, [place] Health Choice, Veterans' Benefits) • Household income should be no more than 200% of the poverty level •The clinic cannot treat you if you are pregnant or think you are pregnant • Sexually transmitted diseases are not treated at the clinic.  ° ° °Dental Care: °Organization         Address  Phone  Notes  °[place] [facility] [address], [place] (336) 641-6152 Accepts children up to age 21 who are enrolled in Medicaid or [place] Health Choice; pregnant women with a Medicaid card; and children who have applied for Medicaid or [place] Health Choice, but were declined, whose parents can pay a reduced fee at time of service.  °[place]  [facility] [place]  [address], [place] (336) 641-7733 Accepts children up to age 21 who are enrolled in Medicaid or [place] Health Choice; pregnant women with a Medicaid card; and children who have applied for Medicaid or  Health Choice, but were declined, whose parents can pay a reduced fee at time of service.  °[facility] ° [address],  (336) 641-4533 Patients are seen by appointment only. Walk-ins are not accepted. [facility] will see patients 18 years of age and older. °Monday - Tuesday (8am-5pm) °Most Wednesdays (8:30-5pm) °$30 per visit, cash only  °[facility] ° [address], [place] (336) 641-4533 Patients are seen by appointment only. Walk-ins are not accepted. [facility] will see patients 18 years of age and older. °One   Wednesday Evening (Monthly: Volunteer Based).  $30 per visit, cash only  °[facility]  (919) 537-3737 for adults; Children under age 4, call Graduate Pediatric Dentistry at (919) [phone]. Children aged 4-14, please call (919) [phone] to request a pediatric application. ° Dental services are provided in all areas of dental care including fillings, crowns and bridges, complete and partial dentures, implants, gum treatment, root canals, and extractions. Preventive care is also provided. Treatment is provided to both adults and children. °Patients are selected via a lottery and there is often a waiting list. °  °[facility] [address], °[place] ° (336) 763-8833 [email or web address] °  °[facility] [address], [place], [place] (336)723-1848, Ext. 123 Second and Fourth Thursday of each month, opens at 6:30 AM; Clinic ends at 9 AM.  Patients are seen on a first-come first-served basis, and a limited number are seen during each clinic.  ° °[facility] ° [address], [place], [place] (336) 723-7904    Eligibility Requirements °You must have lived in [place], [place], or [place] counties for at least the last three months. °  You cannot be eligible for state or federal sponsored healthcare insurance, including Veterans Administration, Medicaid, or Medicare. °  You generally cannot be eligible for healthcare insurance through your employer.  °  How to apply: °Eligibility screenings are held every Tuesday and Wednesday afternoon from 1:00 pm until 4:00 pm. You do not need an appointment for the interview!  °[facility] [address], [place], [place] 336-631-2330   °[facility]  336-342-8273   °[facility]  336-703-3100   °[facility]  336-570-6415   ° °Behavioral Health Resources in the Community: °Intensive Outpatient Programs °Organization         Address  Phone  Notes  °[facility] [address], [place], [place] 336-878-6098   °[facility] [address], [place], [place] 336-832-9800   °[facility] [address], [place], [place] ° 336-882-2125   °[facility] [address],  °[place], [place] 1-800-853-5163 or 336-641-4981   °Substance Abuse Resources °Organization         Address  Phone  Notes  °[facility]  336-882-2125   °[facility]  336-784-9470   °[facility]  336-285-9073   °[facility]  336-845-3988   °Residential & Outpatient Substance Abuse Program  1-800-659-3381   °Psychological Services °Organization         Address  Phone  Notes  °[facility]  336- 832-9600   °[facility]  336- 378-7881   °[facility] [address], [place] 1-800-853-5163 or 336-641-4981   ° °Mobile Crisis Teams °Organization         Address  Phone  Notes  °[facility], Mobile Crisis Care Unit  1-877-626-1772   °Assertive °[facility] ° [address]  [place], [place] 336-834-9664   °Sharon DeEsch [address] °[place] 336-554-5454   ° °Self-Help/Support Groups °Organization         Address  Phone             Notes  °[facility] [place] - variety of support groups  336- 373-1402 Call for more information  °Narcotics Anonymous (NA), [facility] [address], °[place]  2 meetings at this location  ° °  Residential Treatment Programs °Organization         Address  Phone  Notes  °[facility] [address],    °[place]  1-866-801-8205   °[facility] ° [address], [place], [place] 704-293-8524   °[facility] [address], [place] 336-845-3988 Admissions: 8am-3pm M-F  °[facility] [address]    °[place], [place] 336-841-1104   °[facility] [address], [place], [place] 336-379-7146   °[facility] [address]  °[place], [place] 336-285-9073   °[facility] - Intensive Outpatient [address], [place], [place] 336-852-3033   °[facility] [address]  °[place], [place] 1-877-615-2722 or 336-784-9470   °[facility] [address] [place], [place] 336-227-7417 Accepts Medicaid  °[facility] [address]  ° [place] 1-800-659-3381 Substance Abuse/Addiction Treatment  ° °[place] Behavioral Health Resources °Organization         Address  Phone  Notes  °[facility]  (888) 581-9988   °Julie Brannon, PhD [address] [place], [place]   (336) 349-5553 or (336) 951-0000   °[facility]   [address] °[place], [place] (336) 349-4454   °[facility] [address], [place], [place] (336) 342-8316 Insurance/Medicaid/sponsorship through [facility]  °[facility] [address]                                    [place], [place] (336) 342-8316 Therapy/tele-psych/case    °[facility] [address]  ° [place], [place] (336) 349-2233    °Dr. Arfeen  (336) 349-4544   °[facility] [place]  [employer] [place] [facility] 1) [address],  °2) [address], [place] °3)  [address], [place] (336) 349-3220 °(336) 342-7768 ° °(336) 342-8140   °[place] [facility] (336) 342-1394 or (336) 342-3537 (After Hours)    ° ° °

## 2014-09-01 NOTE — ED Notes (Signed)
Patient transported to X-ray 

## 2014-09-02 LAB — RPR: RPR Ser Ql: NONREACTIVE

## 2014-09-02 LAB — URINE CYTOLOGY ANCILLARY ONLY
CHLAMYDIA, DNA PROBE: POSITIVE — AB
NEISSERIA GONORRHEA: NEGATIVE

## 2014-09-02 LAB — HIV ANTIBODY (ROUTINE TESTING W REFLEX)
HIV SCREEN 4TH GENERATION: NONREACTIVE
HIV Screen 4th Generation wRfx: NONREACTIVE

## 2014-09-03 LAB — URINE CYTOLOGY ANCILLARY ONLY
CHLAMYDIA, DNA PROBE: POSITIVE — AB
NEISSERIA GONORRHEA: NEGATIVE

## 2014-09-04 ENCOUNTER — Telehealth (HOSPITAL_BASED_OUTPATIENT_CLINIC_OR_DEPARTMENT_OTHER): Payer: Self-pay | Admitting: Emergency Medicine

## 2014-09-04 NOTE — Telephone Encounter (Signed)
Chart handoff to EDP for treatment plan for + Chlamydia 

## 2014-09-07 ENCOUNTER — Telehealth (HOSPITAL_COMMUNITY): Payer: Self-pay | Admitting: Emergency Medicine

## 2014-09-07 NOTE — Telephone Encounter (Signed)
Post ED Visit - Positive Culture Follow-up: Successful Patient Follow-Up  Positive Chlamydia culture  [x]  Patient discharged without antimicrobial prescription and treatment is now indicated []  Organism is resistant to prescribed ED discharge antimicrobial []  Patient with positive blood cultures  Changes discussed with ED provider: Jeannett Senior, PA New antibiotic prescription:  Zithromax one gram PO x once Called to [facility] [phone]  Contacted patient, date 09/07/14, time 1213   Ernesta Amble 09/07/2014, 12:23 PM

## 2014-11-22 ENCOUNTER — Emergency Department (HOSPITAL_COMMUNITY): Payer: Self-pay

## 2014-11-22 ENCOUNTER — Encounter (HOSPITAL_COMMUNITY): Payer: Self-pay | Admitting: Emergency Medicine

## 2014-11-22 DIAGNOSIS — Z792 Long term (current) use of antibiotics: Secondary | ICD-10-CM | POA: Insufficient documentation

## 2014-11-22 DIAGNOSIS — Y9289 Other specified places as the place of occurrence of the external cause: Secondary | ICD-10-CM | POA: Insufficient documentation

## 2014-11-22 DIAGNOSIS — Z202 Contact with and (suspected) exposure to infections with a predominantly sexual mode of transmission: Secondary | ICD-10-CM | POA: Insufficient documentation

## 2014-11-22 DIAGNOSIS — J441 Chronic obstructive pulmonary disease with (acute) exacerbation: Secondary | ICD-10-CM | POA: Insufficient documentation

## 2014-11-22 DIAGNOSIS — F17210 Nicotine dependence, cigarettes, uncomplicated: Secondary | ICD-10-CM | POA: Insufficient documentation

## 2014-11-22 DIAGNOSIS — S8991XA Unspecified injury of right lower leg, initial encounter: Secondary | ICD-10-CM | POA: Insufficient documentation

## 2014-11-22 DIAGNOSIS — W2209XA Striking against other stationary object, initial encounter: Secondary | ICD-10-CM | POA: Insufficient documentation

## 2014-11-22 DIAGNOSIS — R300 Dysuria: Secondary | ICD-10-CM | POA: Insufficient documentation

## 2014-11-22 DIAGNOSIS — Y9389 Activity, other specified: Secondary | ICD-10-CM | POA: Insufficient documentation

## 2014-11-22 DIAGNOSIS — Y999 Unspecified external cause status: Secondary | ICD-10-CM | POA: Insufficient documentation

## 2014-11-22 NOTE — ED Notes (Addendum)
Pt. presents with multiple complaints : penile discharge , dysuria , right knee pain and requesting prescription for inhaler . Pt. added his request for STD screening.

## 2014-11-23 ENCOUNTER — Emergency Department (HOSPITAL_COMMUNITY)
Admission: EM | Admit: 2014-11-23 | Discharge: 2014-11-23 | Disposition: A | Discharge status: Home / Self Care | Payer: Self-pay | Attending: Emergency Medicine | Admitting: Emergency Medicine

## 2014-11-23 DIAGNOSIS — Z202 Contact with and (suspected) exposure to infections with a predominantly sexual mode of transmission: Secondary | ICD-10-CM

## 2014-11-23 HISTORY — DX: Emphysema, unspecified: J43.9

## 2014-11-23 HISTORY — DX: Bronchitis, not specified as acute or chronic: J40

## 2014-11-23 LAB — URINALYSIS, ROUTINE W REFLEX MICROSCOPIC
BILIRUBIN URINE: NEGATIVE
GLUCOSE, UA: NEGATIVE mg/dL
Hgb urine dipstick: NEGATIVE
KETONES UR: NEGATIVE mg/dL
Nitrite: NEGATIVE
PROTEIN: NEGATIVE mg/dL
Specific Gravity, Urine: 1.028 (ref 1.005–1.030)
pH: 6 (ref 5.0–8.0)

## 2014-11-23 LAB — URINE MICROSCOPIC-ADD ON

## 2014-11-23 LAB — HIV ANTIBODY (ROUTINE TESTING W REFLEX): HIV Screen 4th Generation wRfx: NONREACTIVE

## 2014-11-23 LAB — RPR: RPR Ser Ql: NONREACTIVE

## 2014-11-23 MED ORDER — ALBUTEROL SULFATE HFA 108 (90 BASE) MCG/ACT IN AERS
2.0000 | INHALATION_SPRAY | Freq: Once | RESPIRATORY_TRACT | Status: AC
  Administered 2014-11-23: 2 via RESPIRATORY_TRACT
  Filled 2014-11-23: qty 6.7

## 2014-11-23 MED ORDER — STERILE WATER FOR INJECTION IJ SOLN
INTRAMUSCULAR | Status: AC
  Administered 2014-11-23: 1 mL
  Filled 2014-11-23: qty 10

## 2014-11-23 MED ORDER — AZITHROMYCIN 250 MG PO TABS
1000.0000 mg | ORAL_TABLET | Freq: Once | ORAL | Status: AC
  Administered 2014-11-23: 1000 mg via ORAL
  Filled 2014-11-23: qty 4

## 2014-11-23 MED ORDER — CEFTRIAXONE SODIUM 250 MG IJ SOLR
250.0000 mg | Freq: Once | INTRAMUSCULAR | Status: AC
  Administered 2014-11-23: 250 mg via INTRAMUSCULAR
  Filled 2014-11-23: qty 250

## 2014-11-23 NOTE — ED Notes (Signed)
Reviewed d/c instructions with pt, who voiced understanding. Pt departed under his own power and in NAD.

## 2014-11-23 NOTE — ED Provider Notes (Signed)
CSN: [id number]     Arrival date & time 11/22/14  2252 History  By signing my name below, I, Andres Harding, attest that this documentation has been prepared under the direction and in the presence of Everlene Balls, MD. Electronically Signed: Sonum Harding, Education administrator. 11/23/2014. 12:49 AM.    Chief Complaint  Patient presents with  . Penile Discharge   The history is provided by the patient. No language interpreter was used.     HPI Comments: Andres Harding is a 49 y.o. male who presents to the Emergency Department complaining of 1 day of gradual onset, constant, gradually worsening penile discharge with associated dysuria. He requests an STD screening at this time. He has a secondary complaint of 2 weeks of right knee pain that began after striking on a pole while mowing the lawn. He is also requesting an inhaler due to increased wheezing from his asthma/COPD. He is a current daily smoker.    Past Medical History  Diagnosis Date  . Back pain   . Asthma   . COPD (chronic obstructive pulmonary disease) ([place])   . Emphysema lung ([place])   . Emphysema of lung ([place])   . Bronchitis   . Emphysema of lung ([place])    History reviewed. No pertinent past surgical history. No family history on file. Social History  Substance Use Topics  . Smoking status: Current Every Day Smoker    Types: Cigarettes  . Smokeless tobacco: None  . Alcohol Use: Yes     Comment: occasional    Review of Systems  A complete 10 system review of systems was obtained and all systems are negative except as noted in the HPI and PMH.    Allergies  Review of patient's allergies indicates no known allergies.  Home Medications   Prior to Admission medications   Medication Sig Start Date End Date Taking? Authorizing Provider  albuterol (PROVENTIL HFA;VENTOLIN HFA) 108 (90 BASE) MCG/ACT inhaler Inhale 2-4 puffs into the lungs every 6 (six) hours as needed for wheezing or shortness of breath.     Historical Provider, MD   ciprofloxacin (CILOXAN) 0.3 % ophthalmic ointment 1-2 drops in affected eye every 2 hours while awake for 2 days then every 4 hours while awake for 5 days. 01/16/14   Nicole Pisciotta, PA-C  HYDROcodone-acetaminophen (NORCO/VICODIN) 5-325 MG per tablet Take 2 tablets by mouth every 4 (four) hours as needed. 10/17/13   Leonard Schwartz, MD  ibuprofen (ADVIL,MOTRIN) 200 MG tablet Take 800 mg by mouth every 6 (six) hours as needed for moderate pain.    Historical Provider, MD  Multiple Vitamin (MULTIVITAMIN WITH MINERALS) TABS tablet Take 1 tablet by mouth daily.    Historical Provider, MD  traMADol (ULTRAM) 50 MG tablet Take 1 tablet (50 mg total) by mouth every 6 (six) hours as needed. 01/16/14   Nicole Pisciotta, PA-C   BP 122/79 mmHg  Pulse 83  Temp(Src) 97.5 F (36.4 C) (Oral)  Resp 16  Ht 6\' 7"  (2.007 m)  Wt 179 lb (81.194 kg)  BMI 20.16 kg/m2  SpO2 97% Physical Exam  Constitutional: He is oriented to person, place, and time. Vital signs are normal. He appears well-developed and well-nourished.  Non-toxic appearance. He does not appear ill. No distress.  HENT:  Head: Normocephalic and atraumatic.  Nose: Nose normal.  Mouth/Throat: Oropharynx is clear and moist. No oropharyngeal exudate.  Eyes: Conjunctivae and EOM are normal. Pupils are equal, round, and reactive to light. No scleral icterus.  Neck: Normal  range of motion. Neck supple. No tracheal deviation, no edema, no erythema and normal range of motion present. No thyroid mass and no thyromegaly present.  Cardiovascular: Normal rate, regular rhythm, S1 normal, S2 normal, normal heart sounds, intact distal pulses and normal pulses.  Exam reveals no gallop and no friction rub.   No murmur heard. Pulmonary/Chest: Effort normal. No respiratory distress. He has wheezes (Mild intermittent expiratory wheezing bilaterally ). He has no rhonchi. He has no rales.  Abdominal: Soft. Normal appearance and bowel sounds are normal. He exhibits no  distension, no ascites and no mass. There is no hepatosplenomegaly. There is no tenderness. There is no rebound, no guarding and no CVA tenderness.  Musculoskeletal: Normal range of motion. He exhibits no edema or tenderness.  Lymphadenopathy:    He has no cervical adenopathy.  Neurological: He is alert and oriented to person, place, and time. He has normal strength. No cranial nerve deficit or sensory deficit.  Skin: Skin is warm, dry and intact. No petechiae and no rash noted. He is not diaphoretic. No erythema. No pallor.  Psychiatric: He has a normal mood and affect. His behavior is normal. Judgment normal.  Nursing note and vitals reviewed.   ED Course  Procedures (including critical care time)  DIAGNOSTIC STUDIES: Oxygen Saturation is 97% on RA, adequate by my interpretation.    COORDINATION OF CARE: 12:52 AM Discussed treatment plan with pt at bedside and pt agreed to plan.   Labs Review Labs Reviewed  URINALYSIS, ROUTINE W REFLEX MICROSCOPIC (NOT AT [facility]) - Abnormal; Notable for the following:    APPearance CLOUDY (*)    Leukocytes, UA MODERATE (*)    All other components within normal limits  URINE MICROSCOPIC-ADD ON - Abnormal; Notable for the following:    Squamous Epithelial / LPF 0-5 (*)    Bacteria, UA RARE (*)    Casts HYALINE CASTS (*)    All other components within normal limits  HIV ANTIBODY (ROUTINE TESTING)  RPR  GC/CHLAMYDIA PROBE AMP ([place]) NOT AT [facility]    Imaging Review Dg Knee Complete 4 Views Right  11/22/2014  CLINICAL DATA:  Right knee injury and pain approximately 10 days ago during lawn mowing. Initial encounter. EXAM: RIGHT KNEE - COMPLETE 4+ VIEW COMPARISON:  None. FINDINGS: There is no evidence of fracture, dislocation, or joint effusion. There is no evidence of arthropathy or other focal bone abnormality. Soft tissue swelling noted along the anterior tibial tubercle, although there is no evidence of fracture or cortical irregularity.  IMPRESSION: Soft tissue swelling overlying the anterior tubal tubercle. No evidence of fracture or other osseous abnormality. Electronically Signed   By: Earle Gell M.D.   On: 11/22/2014 23:44   I have personally reviewed and evaluated these images and lab results as part of my medical decision-making.   EKG Interpretation None      MDM   Final diagnoses:  None   Patient presents to the emergency department for multiple complaints. He states he has had penile discharge and dysuria for the past day. He states is consistent with his past STD symptoms. History was ceftriaxone azithromycin. Patient has run out of his inhaler and states she's had some wheezing with his asthma, this is refilled for him as well. Patient finally complains of right knee pain for the past 2 weeks, x-ray was ordered by triage and is negative. Patient given primary care follow-up, he appears well in no acute distress, vital signs were within his normal limits  and he is safe for discharge.    I personally performed the services described in this documentation, which was scribed in my presence. The recorded information has been reviewed and is accurate.     Everlene Balls, MD 11/23/14 [phone]

## 2014-11-23 NOTE — Discharge Instructions (Signed)
Knee Pain Andres Harding, you were treated for STDs and your inhaler was refilled.  See a primary care doctor within 3 days for close follow up.  If symptoms worsen, come back to the ED immediately. Thank you. Knee pain is a common problem. It can have many causes. The pain often goes away by following your doctor's home care instructions. Treatment for ongoing pain will depend on the cause of your pain. If your knee pain continues, more tests may be needed to diagnose your condition. Tests may include X-rays or other imaging studies of your knee. HOME CARE  Take medicines only as told by your doctor.  Rest your knee and keep it raised (elevated) while you are resting.  Do not do things that cause pain or make your pain worse.  Avoid activities where both feet leave the ground at the same time, such as running, jumping rope, or doing jumping jacks.  Apply ice to the knee area:  Put ice in a plastic bag.  Place a towel between your skin and the bag.  Leave the ice on for 20 minutes, 2-3 times a day.  Ask your doctor if you should wear an elastic knee support.  Sleep with a pillow under your knee.  Lose weight if you are overweight. Being overweight can make your knee hurt more.  Do not use any tobacco products, including cigarettes, chewing tobacco, or electronic cigarettes. If you need help quitting, ask your doctor. Smoking may slow the healing of any bone and joint problems that you may have. GET HELP IF:  Your knee pain does not stop, it changes, or it gets worse.  You have a fever along with knee pain.  Your knee gives out or locks up.  Your knee becomes more swollen. GET HELP RIGHT AWAY IF:   Your knee feels hot to the touch.  You have chest pain or trouble breathing.   This information is not intended to replace advice given to you by your health care provider. Make sure you discuss any questions you have with your health care provider.   Document Released: 03/19/2008  Document Revised: 01/11/2014 Document Reviewed: 02/21/2013 Elsevier Interactive Patient Education 2016 [place]. Asthma, Adult Asthma is a condition of the lungs in which the airways tighten and narrow. Asthma can make it hard to breathe. Asthma cannot be cured, but medicine and lifestyle changes can help control it. Asthma may be started (triggered) by:  Animal skin flakes (dander).  Dust.  Cockroaches.  Pollen.  Mold.  Smoke.  Cleaning products.  Hair sprays or aerosol sprays.  Paint fumes or strong smells.  Cold air, weather changes, and winds.  Crying or laughing hard.  Stress.  Certain medicines or drugs.  Foods, such as dried fruit, potato chips, and sparkling grape juice.  Infections or conditions (colds, flu).  Exercise.  Certain medical conditions or diseases.  Exercise or tiring activities. HOME CARE   Take medicine as told by your doctor.  Use a peak flow meter as told by your doctor. A peak flow meter is a tool that measures how well the lungs are working.  Record and keep track of the peak flow meter's readings.  Understand and use the asthma action plan. An asthma action plan is a written plan for taking care of your asthma and treating your attacks.  To help prevent asthma attacks:  Do not smoke. Stay away from secondhand smoke.  Change your heating and air conditioning filter often.  Limit your  use of fireplaces and wood stoves.  Get rid of pests (such as roaches and mice) and their droppings.  Throw away plants if you see mold on them.  Clean your floors. Dust regularly. Use cleaning products that do not smell.  Have someone vacuum when you are not home. Use a vacuum cleaner with a HEPA filter if possible.  Replace carpet with wood, tile, or vinyl flooring. Carpet can trap animal skin flakes and dust.  Use allergy-proof pillows, mattress covers, and box spring covers.  Wash bed sheets and blankets every week in hot water and  dry them in a dryer.  Use blankets that are made of polyester or cotton.  Clean bathrooms and kitchens with bleach. If possible, have someone repaint the walls in these rooms with mold-resistant paint. Keep out of the rooms that are being cleaned and painted.  Wash hands often. GET HELP IF:  You have make a whistling sound when breaking (wheeze), have shortness of breath, or have a cough even if taking medicine to prevent attacks.  The colored mucus you cough up (sputum) is thicker than usual.  The colored mucus you cough up changes from clear or white to yellow, green, gray, or bloody.  You have problems from the medicine you are taking such as:  A rash.  Itching.  Swelling.  Trouble breathing.  You need reliever medicines more than 2-3 times a week.  Your peak flow measurement is still at 50-79% of your personal best after following the action plan for 1 hour.  You have a fever. GET HELP RIGHT AWAY IF:   You seem to be worse and are not responding to medicine during an asthma attack.  You are short of breath even at rest.  You get short of breath when doing very little activity.  You have trouble eating, drinking, or talking.  You have chest pain.  You have a fast heartbeat.  Your lips or fingernails start to turn blue.  You are light-headed, dizzy, or faint.  Your peak flow is less than 50% of your personal best.   This information is not intended to replace advice given to you by your health care provider. Make sure you discuss any questions you have with your health care provider.   Document Released: 06/09/2007 Document Revised: 09/11/2014 Document Reviewed: 07/20/2012 Elsevier Interactive Patient Education [employer]. Sexually Transmitted Disease A sexually transmitted disease (STD) is a disease or infection often passed to another person during sex. However, STDs can be passed through nonsexual ways. An STD can be passed through:  Spit  (saliva).  Semen.  Blood.  Mucus from the vagina.  Pee (urine). HOW CAN I LESSEN MY CHANCES OF GETTING AN STD?  Use:  Latex condoms.  Water-soluble lubricants with condoms. Do not use petroleum jelly or oils.  Dental dams. These are small pieces of latex that are used as a barrier during oral sex.  Avoid having more than one sex partner.  Do not have sex with someone who has other sex partners.  Do not have sex with anyone you do not know or who is at high risk for an STD.  Avoid risky sex that can break your skin.  Do not have sex if you have open sores on your mouth or skin.  Avoid drinking too much alcohol or taking illegal drugs. Alcohol and drugs can affect your good judgment.  Avoid oral and anal sex acts.  Get shots (vaccines) for HPV and hepatitis.  If  you are at risk of being infected with HIV, it is advised that you take a certain medicine daily to prevent HIV infection. This is called pre-exposure prophylaxis (PrEP). You may be at risk if:  You are a man who has sex with other men (MSM).  You are attracted to the opposite sex (heterosexual) and are having sex with more than one partner.  You take drugs with a needle.  You have sex with someone who has HIV.  Talk with your doctor about if you are at high risk of being infected with HIV. If you begin to take PrEP, get tested for HIV first. Get tested every 3 months for as long as you are taking PrEP.  Get tested for STDs every year if you are sexually active. If you are treated for an STD, get tested again 3 months after you are treated. WHAT SHOULD I DO IF I THINK I HAVE AN STD?  See your doctor.  Tell your sex partner(s) that you have an STD. They should be tested and treated.  Do not have sex until your doctor says it is okay. WHEN SHOULD I GET HELP? Get help right away if:  You have bad belly (abdominal) pain.  You are a man and have puffiness (swelling) or pain in your testicles.  You are a  woman and have puffiness in your vagina.   This information is not intended to replace advice given to you by your health care provider. Make sure you discuss any questions you have with your health care provider.   Document Released: 01/29/2004 Document Revised: 01/11/2014 Document Reviewed: 06/16/2012 Elsevier Interactive Patient Education [employer].

## 2014-11-27 LAB — GC/CHLAMYDIA PROBE AMP (~~LOC~~) NOT AT ARMC
CHLAMYDIA, DNA PROBE: NEGATIVE
NEISSERIA GONORRHEA: POSITIVE — AB

## 2014-11-29 ENCOUNTER — Telehealth: Payer: Self-pay | Admitting: Emergency Medicine

## 2014-11-29 ENCOUNTER — Telehealth (HOSPITAL_BASED_OUTPATIENT_CLINIC_OR_DEPARTMENT_OTHER): Payer: Self-pay | Admitting: Emergency Medicine

## 2014-11-29 NOTE — Telephone Encounter (Signed)
+  GC, was treatd while in the ED, [employer] faxed, attemptng to notify patient

## 2016-02-25 ENCOUNTER — Encounter: Payer: Self-pay | Admitting: Emergency Medicine

## 2016-02-25 ENCOUNTER — Emergency Department
Admission: EM | Admit: 2016-02-25 | Discharge: 2016-02-26 | Disposition: A | Discharge status: Home / Self Care | Payer: Self-pay | Attending: Emergency Medicine | Admitting: Emergency Medicine

## 2016-02-25 DIAGNOSIS — R109 Unspecified abdominal pain: Secondary | ICD-10-CM | POA: Insufficient documentation

## 2016-02-25 DIAGNOSIS — J4521 Mild intermittent asthma with (acute) exacerbation: Secondary | ICD-10-CM | POA: Insufficient documentation

## 2016-02-25 DIAGNOSIS — J449 Chronic obstructive pulmonary disease, unspecified: Secondary | ICD-10-CM | POA: Insufficient documentation

## 2016-02-25 DIAGNOSIS — Z79899 Other long term (current) drug therapy: Secondary | ICD-10-CM | POA: Insufficient documentation

## 2016-02-25 DIAGNOSIS — H6012 Cellulitis of left external ear: Secondary | ICD-10-CM | POA: Insufficient documentation

## 2016-02-25 DIAGNOSIS — R112 Nausea with vomiting, unspecified: Secondary | ICD-10-CM | POA: Insufficient documentation

## 2016-02-25 DIAGNOSIS — F17210 Nicotine dependence, cigarettes, uncomplicated: Secondary | ICD-10-CM | POA: Insufficient documentation

## 2016-02-25 HISTORY — DX: Malignant (primary) neoplasm, unspecified: C80.1

## 2016-02-25 LAB — COMPREHENSIVE METABOLIC PANEL
ALT: 20 U/L (ref 17–63)
ANION GAP: 7 (ref 5–15)
AST: 22 U/L (ref 15–41)
Albumin: 4.2 g/dL (ref 3.5–5.0)
Alkaline Phosphatase: 56 U/L (ref 38–126)
BILIRUBIN TOTAL: 0.4 mg/dL (ref 0.3–1.2)
BUN: 19 mg/dL (ref 6–20)
CO2: 27 mmol/L (ref 22–32)
Calcium: 8.9 mg/dL (ref 8.9–10.3)
Chloride: 103 mmol/L (ref 101–111)
Creatinine, Ser: 0.98 mg/dL (ref 0.61–1.24)
GFR calc Af Amer: >60 mL/min (ref >60)
Glucose, Bld: 111 mg/dL — ABNORMAL HIGH (ref 65–99)
POTASSIUM: 4.1 mmol/L (ref 3.5–5.1)
Sodium: 137 mmol/L (ref 135–145)
TOTAL PROTEIN: 7.1 g/dL (ref 6.5–8.1)

## 2016-02-25 LAB — URINALYSIS, COMPLETE (UACMP) WITH MICROSCOPIC
BACTERIA UA: NONE SEEN
BILIRUBIN URINE: NEGATIVE
GLUCOSE, UA: NEGATIVE mg/dL
HGB URINE DIPSTICK: NEGATIVE
Ketones, ur: NEGATIVE mg/dL
LEUKOCYTES UA: NEGATIVE
NITRITE: NEGATIVE
PH: 6 (ref 5.0–8.0)
Protein, ur: NEGATIVE mg/dL
SPECIFIC GRAVITY, URINE: 1.021 (ref 1.005–1.030)
Squamous Epithelial / LPF: NONE SEEN

## 2016-02-25 LAB — CBC
HEMATOCRIT: 43.1 % (ref 40.0–52.0)
Hemoglobin: 14.8 g/dL (ref 13.0–18.0)
MCH: 29.2 pg (ref 26.0–34.0)
MCHC: 34.3 g/dL (ref 32.0–36.0)
MCV: 85.2 fL (ref 80.0–100.0)
Platelets: 251 10*3/uL (ref 150–440)
RBC: 5.06 MIL/uL (ref 4.40–5.90)
RDW: 14.3 % (ref 11.5–14.5)
WBC: 8.3 10*3/uL (ref 3.8–10.6)

## 2016-02-25 LAB — LIPASE, BLOOD: Lipase: 27 U/L (ref 11–51)

## 2016-02-25 MED ORDER — ONDANSETRON HCL 4 MG/2ML IJ SOLN
4.0000 mg | Freq: Once | INTRAMUSCULAR | Status: AC
  Administered 2016-02-25: 4 mg via INTRAVENOUS
  Filled 2016-02-25: qty 2

## 2016-02-25 MED ORDER — ALBUTEROL SULFATE (2.5 MG/3ML) 0.083% IN NEBU
2.5000 mg | INHALATION_SOLUTION | Freq: Once | RESPIRATORY_TRACT | Status: AC
  Administered 2016-02-25: 2.5 mg via RESPIRATORY_TRACT
  Filled 2016-02-25: qty 3

## 2016-02-25 MED ORDER — ONDANSETRON 4 MG PO TBDP: 4.0000 mg | ORAL_TABLET | Freq: Once | ORAL | Status: DC | PRN

## 2016-02-25 MED ORDER — SODIUM CHLORIDE 0.9 % IV BOLUS (SEPSIS)
1000.0000 mL | Freq: Once | INTRAVENOUS | Status: AC
  Administered 2016-02-25: 1000 mL via INTRAVENOUS

## 2016-02-25 NOTE — ED Provider Notes (Signed)
[facility] Department Provider Note   First MD Initiated Contact with Patient 02/25/16 2055     (approximate)  I have reviewed the triage vital signs and the nursing notes.   HISTORY  Chief Complaint Emesis and Asthma    HPI Andres Harding is a 51 y.o. male to multiple medical complaints including acute onset of abdominal cramping and vomiting which started approximately 6 PM this evening. Patient states no abdominal pain at this time however continued nausea. Patient also admits to "asthma attack which started at approximately 6:30 with wheezing now improved. Patient denies any fever area patient also states that he believes he has a left earlobe infection that he had purulent drainage from that area yesterday.   Past Medical History:  Diagnosis Date  . Asthma   . Back pain   . Bronchitis   . Cancer ([place])   . COPD (chronic obstructive pulmonary disease) ([place])   . Emphysema lung ([place])   . Emphysema of lung ([place])   . Emphysema of lung ([place])     There are no active problems to display for this patient.   History reviewed. No pertinent surgical history.  Prior to Admission medications   Medication Sig Start Date End Date Taking? Authorizing Provider  albuterol (PROVENTIL HFA;VENTOLIN HFA) 108 (90 BASE) MCG/ACT inhaler Inhale 2-4 puffs into the lungs every 6 (six) hours as needed for wheezing or shortness of breath.     Historical Provider, MD  albuterol (PROVENTIL HFA;VENTOLIN HFA) 108 (90 Base) MCG/ACT inhaler Inhale 2 puffs into the lungs every 6 (six) hours as needed for wheezing or shortness of breath. 02/26/16   Gregor Hams, MD  cephALEXin (KEFLEX) 500 MG capsule Take 1 capsule (500 mg total) by mouth 2 (two) times daily. 02/26/16 03/07/16  Gregor Hams, MD  ciprofloxacin (CILOXAN) 0.3 % ophthalmic ointment 1-2 drops in affected eye every 2 hours while awake for 2 days then every 4 hours while awake for 5 days. 01/16/14   Nicole  Pisciotta, PA-C  HYDROcodone-acetaminophen (NORCO/VICODIN) 5-325 MG per tablet Take 2 tablets by mouth every 4 (four) hours as needed. 10/17/13   Leonard Schwartz, MD  ibuprofen (ADVIL,MOTRIN) 200 MG tablet Take 800 mg by mouth every 6 (six) hours as needed for moderate pain.    Historical Provider, MD  Multiple Vitamin (MULTIVITAMIN WITH MINERALS) TABS tablet Take 1 tablet by mouth daily.    Historical Provider, MD  ondansetron (ZOFRAN) 4 MG tablet Take 1 tablet (4 mg total) by mouth daily as needed for nausea or vomiting. 02/26/16 02/25/17  Gregor Hams, MD  traMADol (ULTRAM) 50 MG tablet Take 1 tablet (50 mg total) by mouth every 6 (six) hours as needed. 01/16/14   Nicole Pisciotta, PA-C    Allergies Patient has no known allergies.  No family history on file.  Social History Social History  Substance Use Topics  . Smoking status: Current Every Day Smoker    Types: Cigarettes  . Smokeless tobacco: Never Used  . Alcohol use Yes     Comment: occasional    Review of Systems Constitutional: No fever/chills Eyes: No visual changes. ENT: No sore throat. Cardiovascular: Denies chest pain. Respiratory: Denies shortness of breath. Gastrointestinal: No abdominal pain.  No nausea, no vomiting.  No diarrhea.  No constipation. Genitourinary: Negative for dysuria. Musculoskeletal: Negative for back pain. Skin: Negative for rash. Neurological: Negative for headaches, focal weakness or numbness.  10-point ROS otherwise negative.  ____________________________________________  PHYSICAL EXAM:  VITAL SIGNS: ED Triage Vitals  Enc Vitals Group     BP 02/25/16 2021 (!) 175/96     Pulse Rate 02/25/16 2021 (!) 54     Resp 02/25/16 2021 18     Temp 02/25/16 2021 97.9 F (36.6 C)     Temp Source 02/25/16 2021 Oral     SpO2 02/25/16 2021 97 %     Weight 02/25/16 2022 180 lb (81.6 kg)     Height 02/25/16 2022 6\' 7"  (2.007 m)     Head Circumference --      Peak Flow --      Pain Score  02/25/16 2022 5     Pain Loc --      Pain Edu? --      Excl. in [place]? --     Constitutional: Alert and oriented. Well appearing and in no acute distress. Eyes: Conjunctivae are normal. PERRL. EOMI. Head: Atraumatic. Ears:  Healthy appearing ear canals and TMs bilaterally. Left earlobe erythema Nose: No congestion/rhinnorhea. Mouth/Throat: Mucous membranes are moist. Neck: No stridor.  Cardiovascular: Normal rate, regular rhythm. Good peripheral circulation. Grossly normal heart sounds. Respiratory: Normal respiratory effort.  No retractions. Lungs CTAB. Gastrointestinal: Soft and nontender. No distention.  Musculoskeletal: No lower extremity tenderness nor edema. No gross deformities of extremities. Neurologic:  Normal speech and language. No gross focal neurologic deficits are appreciated.  Skin:  Skin is warm, dry and intact. No rash noted. Psychiatric: Mood and affect are normal. Speech and behavior are normal.  ____________________________________________   LABS (all labs ordered are listed, but only abnormal results are displayed)  Labs Reviewed  COMPREHENSIVE METABOLIC PANEL - Abnormal; Notable for the following:       Result Value   Glucose, Bld 111 (*)    All other components within normal limits  URINALYSIS, COMPLETE (UACMP) WITH MICROSCOPIC - Abnormal; Notable for the following:    Color, Urine YELLOW (*)    APPearance CLEAR (*)    All other components within normal limits  LIPASE, BLOOD  CBC    RADIOLOGY I, [employer] N BROWN, personally viewed and evaluated these images (plain radiographs) as part of my medical decision making, as well as reviewing the written report by the radiologist.  Ct Abdomen Pelvis W Contrast  Result Date: 02/26/2016 CLINICAL DATA:  Constant abdominal pain and vomiting. EXAM: CT ABDOMEN AND PELVIS WITH CONTRAST TECHNIQUE: Multidetector CT imaging of the abdomen and pelvis was performed using the standard protocol following bolus  administration of intravenous contrast. CONTRAST:  160mL ISOVUE-300 IOPAMIDOL (ISOVUE-300) INJECTION 61% COMPARISON:  None. FINDINGS: Lower chest: No pulmonary nodules. No visible pleural or pericardial effusion. Hepatobiliary: Normal hepatic size and contours without focal liver lesion. No perihepatic ascites. No intra- or extrahepatic biliary dilatation. Small left hepatic lobe cyst. Normal gallbladder. Pancreas: Normal pancreatic contours and enhancement. No peripancreatic fluid collection or pancreatic ductal dilatation. Spleen: Normal. Adrenals/Urinary Tract: Normal adrenal glands. No hydronephrosis or solid renal mass. Stomach/Bowel: No abnormal bowel dilatation. No bowel wall thickening or adjacent fat stranding to indicate acute inflammation. No abdominal fluid collection. The appendix is not visualized. Vascular/Lymphatic: There is atherosclerotic calcification of the non aneurysmal abdominal aorta. No abdominal or pelvic adenopathy. Reproductive: There are calcifications within the prostate. Musculoskeletal: Multilevel lumbar osteophytosis. No bony spinal canal stenosis. Normal visualized extrathoracic and extraperitoneal soft tissues. Other: No contributory non-categorized findings. IMPRESSION: 1. No acute abnormality of the abdomen or pelvis. 2. Aortic atherosclerosis. Electronically Signed   By: Lennette Bihari  [place] M.D.   On: 02/26/2016 01:57     Procedures     INITIAL IMPRESSION / ASSESSMENT AND PLAN / ED COURSE  Pertinent labs & imaging results that were available during my care of the patient were reviewed by me and considered in my medical decision making (see chart for details).  Patient given multiple doses of antiemetic however continues to have vomiting. On my arrival to room the patient actively sticking his finger in his throat to make himself vomit when I asked him why he was doing so he stated that it "makes me feel better".       ____________________________________________  FINAL CLINICAL IMPRESSION(S) / ED DIAGNOSES  Final diagnoses:  Non-intractable vomiting with nausea, unspecified vomiting type  Cellulitis of left earlobe  Mild intermittent asthma with acute exacerbation     MEDICATIONS GIVEN DURING THIS VISIT:  Medications  albuterol (PROVENTIL) (2.5 MG/3ML) 0.083% nebulizer solution 2.5 mg (2.5 mg Nebulization Given 02/25/16 2322)  sodium chloride 0.9 % bolus 1,000 mL (0 mLs Intravenous Stopped 02/26/16 0037)  ondansetron (ZOFRAN) injection 4 mg (4 mg Intravenous Given 02/25/16 2319)  promethazine (PHENERGAN) injection 12.5 mg (12.5 mg Intravenous Given 02/26/16 0017)  iopamidol (ISOVUE-300) 61 % injection 30 mL (30 mLs Oral Contrast Given 02/26/16 0021)  iopamidol (ISOVUE-300) 61 % injection 100 mL (100 mLs Intravenous Contrast Given 02/26/16 0111)  dicyclomine (BENTYL) capsule 10 mg (10 mg Oral Given 02/26/16 0247)     NEW OUTPATIENT MEDICATIONS STARTED DURING THIS VISIT:  Discharge Medication List as of 02/26/2016  2:22 AM    START taking these medications   Details  !! albuterol (PROVENTIL HFA;VENTOLIN HFA) 108 (90 Base) MCG/ACT inhaler Inhale 2 puffs into the lungs every 6 (six) hours as needed for wheezing or shortness of breath., Starting Thu 02/26/2016, Print    cephALEXin (KEFLEX) 500 MG capsule Take 1 capsule (500 mg total) by mouth 2 (two) times daily., Starting Thu 02/26/2016, Until Sun 03/07/2016, Print    ondansetron (ZOFRAN) 4 MG tablet Take 1 tablet (4 mg total) by mouth daily as needed for nausea or vomiting., Starting Thu 02/26/2016, Until Fri 02/25/2017, Print     !! - Potential duplicate medications found. Please discuss with provider.      Discharge Medication List as of 02/26/2016  2:22 AM      Discharge Medication List as of 02/26/2016  2:22 AM       Note:  This document was prepared using Dragon voice recognition software and may include unintentional dictation errors.     Gregor Hams, MD 02/26/16 [id number]

## 2016-02-25 NOTE — ED Triage Notes (Addendum)
Pt presents to ED with c/o vomiting 3X in the past couple of hours with abd "constant pain" in his "stomach". Denies diarrhea, or fever.  Pt also reports that he thinks his ear lobe may be infected because he noticed drainage from it yesterday. Pt states he might be starting to have an an asthma attack now as well. Pt appears uncomfortable. Blanket and emesis bag provided for pt comfort.

## 2016-02-26 ENCOUNTER — Emergency Department: Payer: Self-pay

## 2016-02-26 MED ORDER — PROMETHAZINE HCL 25 MG/ML IJ SOLN
12.5000 mg | Freq: Once | INTRAMUSCULAR | Status: AC
  Administered 2016-02-26: 12.5 mg via INTRAVENOUS
  Filled 2016-02-26: qty 1

## 2016-02-26 MED ORDER — ALBUTEROL SULFATE HFA 108 (90 BASE) MCG/ACT IN AERS: 2.0000 | INHALATION_SPRAY | Freq: Four times a day (QID) | RESPIRATORY_TRACT | 2 refills | Status: DC | PRN

## 2016-02-26 MED ORDER — CEPHALEXIN 500 MG PO CAPS: 500.0000 mg | ORAL_CAPSULE | Freq: Two times a day (BID) | ORAL | 0 refills | Status: AC

## 2016-02-26 MED ORDER — DICYCLOMINE HCL 10 MG PO CAPS
10.0000 mg | ORAL_CAPSULE | Freq: Once | ORAL | Status: AC
  Administered 2016-02-26: 10 mg via ORAL
  Filled 2016-02-26: qty 1

## 2016-02-26 MED ORDER — IOPAMIDOL (ISOVUE-300) INJECTION 61%
30.0000 mL | Freq: Once | INTRAVENOUS | Status: AC | PRN
  Administered 2016-02-26: 30 mL via ORAL

## 2016-02-26 MED ORDER — ONDANSETRON HCL 4 MG PO TABS: 4.0000 mg | ORAL_TABLET | Freq: Every day | ORAL | 1 refills | Status: AC | PRN

## 2016-02-26 MED ORDER — IOPAMIDOL (ISOVUE-300) INJECTION 61%
100.0000 mL | Freq: Once | INTRAVENOUS | Status: AC | PRN
  Administered 2016-02-26: 100 mL via INTRAVENOUS

## 2016-02-26 NOTE — ED Notes (Signed)
Pt reports nausea has not improved, MD made aware.

## 2016-02-26 NOTE — ED Notes (Signed)
Pt took off BP cuff and O2 monitor. Explained to pt this is how we monitor him and make sure his vitals are ok. Pt still does not want to have BP cuff or O2 sticker on and reports he is unable to rest well with it on. Pt was also asked about 3 emesis bags laying on floor beside bed. Pt states he is not sure how the bags got to the floor.

## 2016-02-26 NOTE — ED Notes (Signed)
Pt was given 10mg  of Bentyl PO. Pt took medication without difficulty noted.

## 2016-02-26 NOTE — ED Notes (Signed)
This nurse went in to check on pt, pt sleeping in bed and is in no acute distress at this time.

## 2016-02-26 NOTE — ED Notes (Addendum)
Pt unable to complete drinking contrast, pt drank half of first bottle and reported he threw it up. MD notified. Ct notified as well that pt can get scan done per MD.

## 2016-07-09 ENCOUNTER — Emergency Department (HOSPITAL_COMMUNITY): Payer: Self-pay

## 2016-07-09 ENCOUNTER — Encounter (HOSPITAL_COMMUNITY): Payer: Self-pay | Admitting: Emergency Medicine

## 2016-07-09 ENCOUNTER — Emergency Department (HOSPITAL_COMMUNITY)
Admission: EM | Admit: 2016-07-09 | Discharge: 2016-07-09 | Disposition: A | Discharge status: Home / Self Care | Payer: Self-pay | Attending: Emergency Medicine | Admitting: Emergency Medicine

## 2016-07-09 DIAGNOSIS — J449 Chronic obstructive pulmonary disease, unspecified: Secondary | ICD-10-CM | POA: Insufficient documentation

## 2016-07-09 DIAGNOSIS — F17210 Nicotine dependence, cigarettes, uncomplicated: Secondary | ICD-10-CM | POA: Insufficient documentation

## 2016-07-09 DIAGNOSIS — L03112 Cellulitis of left axilla: Secondary | ICD-10-CM | POA: Insufficient documentation

## 2016-07-09 MED ORDER — IBUPROFEN 800 MG PO TABS: 800.0000 mg | ORAL_TABLET | Freq: Three times a day (TID) | ORAL | 0 refills | Status: DC

## 2016-07-09 MED ORDER — CEFTRIAXONE SODIUM 1 G IJ SOLR
1.0000 g | Freq: Once | INTRAMUSCULAR | Status: AC
  Administered 2016-07-09: 1 g via INTRAMUSCULAR
  Filled 2016-07-09: qty 10

## 2016-07-09 MED ORDER — LIDOCAINE HCL (PF) 1 % IJ SOLN
2.0000 mL | Freq: Once | INTRAMUSCULAR | Status: AC
  Administered 2016-07-09: 2 mL via INTRADERMAL

## 2016-07-09 MED ORDER — LIDOCAINE HCL (PF) 1 % IJ SOLN
INTRAMUSCULAR | Status: AC
  Administered 2016-07-09: 2 mL via INTRADERMAL
  Filled 2016-07-09: qty 5

## 2016-07-09 MED ORDER — SULFAMETHOXAZOLE-TRIMETHOPRIM 800-160 MG PO TABS: 1.0000 | ORAL_TABLET | Freq: Two times a day (BID) | ORAL | 0 refills | Status: AC

## 2016-07-09 NOTE — ED Provider Notes (Signed)
[facility] DEPT Provider Note   CSN: [id number] Arrival date & time: 07/09/16  0543     History   Chief Complaint Chief Complaint  Patient presents with  . Abscess    HPI Andres Harding is a 51 y.o. male.  The history is provided by the patient. No language interpreter was used.  Abscess  Location:  Torso Torso abscess location:  L axilla Size:  4 cm Abscess quality: draining   Red streaking: no   Duration:  3 weeks Progression:  Worsening Chronicity:  New Context: insect bite/sting   Relieved by:  Nothing Worsened by:  Nothing Ineffective treatments:  None tried Pt complains of multiple infected bug bites.  Pt reports he now has a red and swollen area under his left arm.  Pt worried about an abscess  Past Medical History:  Diagnosis Date  . Asthma   . Back pain   . Bronchitis   . Cancer ([place])   . COPD (chronic obstructive pulmonary disease) ([place])   . Emphysema lung ([place])   . Emphysema of lung ([place])   . Emphysema of lung ([place])     There are no active problems to display for this patient.   History reviewed. No pertinent surgical history.     Home Medications    Prior to Admission medications   Medication Sig Start Date End Date Taking? Authorizing Provider  albuterol (PROVENTIL HFA;VENTOLIN HFA) 108 (90 BASE) MCG/ACT inhaler Inhale 2-4 puffs into the lungs every 6 (six) hours as needed for wheezing or shortness of breath.     [provider]  albuterol (PROVENTIL HFA;VENTOLIN HFA) 108 (90 Base) MCG/ACT inhaler Inhale 2 puffs into the lungs every 6 (six) hours as needed for wheezing or shortness of breath. 02/26/16   Gregor Hams, MD  ciprofloxacin (CILOXAN) 0.3 % ophthalmic ointment 1-2 drops in affected eye every 2 hours while awake for 2 days then every 4 hours while awake for 5 days. 01/16/14   Pisciotta, Elmyra Ricks, PA-C  HYDROcodone-acetaminophen (NORCO/VICODIN) 5-325 MG per tablet Take 2 tablets by mouth every 4 (four) hours as needed.  10/17/13   Leonard Schwartz, MD  ibuprofen (ADVIL,MOTRIN) 800 MG tablet Take 1 tablet (800 mg total) by mouth 3 (three) times daily. 07/09/16   Fransico Meadow, PA-C  Multiple Vitamin (MULTIVITAMIN WITH MINERALS) TABS tablet Take 1 tablet by mouth daily.    [provider]  ondansetron (ZOFRAN) 4 MG tablet Take 1 tablet (4 mg total) by mouth daily as needed for nausea or vomiting. 02/26/16 02/25/17  Gregor Hams, MD  sulfamethoxazole-trimethoprim (BACTRIM DS,SEPTRA DS) 800-160 MG tablet Take 1 tablet by mouth 2 (two) times daily. 07/09/16 07/16/16  Fransico Meadow, PA-C  traMADol (ULTRAM) 50 MG tablet Take 1 tablet (50 mg total) by mouth every 6 (six) hours as needed. 01/16/14   Pisciotta, Charna Elizabeth    Family History No family history on file.  Social History Social History  Substance Use Topics  . Smoking status: Current Every Day Smoker    Types: Cigarettes  . Smokeless tobacco: Never Used  . Alcohol use Yes     Comment: occasional     Allergies   Patient has no known allergies.   Review of Systems Review of Systems  Skin: Positive for color change and rash.  All other systems reviewed and are negative.    Physical Exam Updated Vital Signs BP 124/87 (BP Location: Right Arm)   Pulse 83   Temp 98.3 F (  36.8 C) (Oral)   Resp 16   SpO2 100%   Physical Exam  Constitutional: He appears well-developed and well-nourished.  HENT:  Head: Normocephalic.  Eyes: Pupils are equal, round, and reactive to light.  Neck: Normal range of motion.  Cardiovascular: Normal rate.   Pulmonary/Chest: Effort normal.  Musculoskeletal: Normal range of motion.  Neurological: He is alert.  Skin: Skin is warm.  Psychiatric: He has a normal mood and affect.  Nursing note and vitals reviewed.    ED Treatments / Results  Labs (all labs ordered are listed, but only abnormal results are displayed) Labs Reviewed - No data to display  EKG  EKG Interpretation None        Radiology Dg Chest 2 View  Result Date: 07/09/2016 CLINICAL DATA:  Large left axillary lymph node post mosquito bite. EXAM: CHEST  2 VIEW COMPARISON:  July 16, 2014, October 20, 2012 FINDINGS: The mediastinal contour and cardiac silhouette are normal. There is no focal infiltrate, pulmonary edema, or pleural effusion. Bibasilar nipple shadows are identified there is increased density over the left apex which may be due to increased sclerosis of the superimposed bony structures but underlying pulmonary mass is not excluded. IMPRESSION: No focal pneumonia or pulmonary edema. Increased density over the left apex which may be due to increased sclerosis of superimposed bony structures per underlying pulmonary mass is not excluded. Further evaluation with chest CT on outpatient basis is recommended. Electronically Signed   By: Abelardo Diesel M.D.   On: 07/09/2016 08:24    Procedures Procedures (including critical care time)  Medications Ordered in ED Medications  cefTRIAXone (ROCEPHIN) injection 1 g (1 g Intramuscular Given 07/09/16 0837)  lidocaine (PF) (XYLOCAINE) 1 % injection 2 mL (2 mLs Intradermal Given 07/09/16 0837)     Initial Impression / Assessment and Plan / ED Course  I have reviewed the triage vital signs and the nursing notes.  Pertinent labs & imaging results that were available during my care of the patient were reviewed by me and considered in my medical decision making (see chart for details).     Swollen area feels like a lymph node.   I doubt abscess.  Chest xray no infection.  No masses.  Pt given Rocephin IM.    Final Clinical Impressions(s) / ED Diagnoses   Final diagnoses:  Cellulitis of left axilla    New Prescriptions Discharge Medication List as of 07/09/2016  8:40 AM    START taking these medications   Details  sulfamethoxazole-trimethoprim (BACTRIM DS,SEPTRA DS) 800-160 MG tablet Take 1 tablet by mouth 2 (two) times daily., Starting Fri 07/09/2016, Until Fri  07/16/2016, Print         Fransico Meadow, PA-C 07/09/16 1243    Carmin Muskrat, MD 07/12/16 1245

## 2016-07-09 NOTE — Discharge Instructions (Signed)
Recheck at Urgent care in 2 days.  Continue warm compresses

## 2016-07-09 NOTE — ED Triage Notes (Signed)
Per pt was "in a field 3 weeks ago and got a lot of mosquito bites"  Then through the day yesterday swelling and redness occurred under his left arm.  Pt has taken tylenol at home.

## 2016-07-09 NOTE — ED Notes (Signed)
Patient transported to X-ray 

## 2016-09-28 ENCOUNTER — Encounter (HOSPITAL_COMMUNITY): Payer: Self-pay | Admitting: Neurology

## 2016-09-28 ENCOUNTER — Emergency Department (HOSPITAL_COMMUNITY)
Admission: EM | Admit: 2016-09-28 | Discharge: 2016-09-28 | Disposition: A | Discharge status: Home / Self Care | Payer: Self-pay | Attending: Emergency Medicine | Admitting: Emergency Medicine

## 2016-09-28 DIAGNOSIS — L0231 Cutaneous abscess of buttock: Secondary | ICD-10-CM | POA: Insufficient documentation

## 2016-09-28 DIAGNOSIS — Z79899 Other long term (current) drug therapy: Secondary | ICD-10-CM | POA: Insufficient documentation

## 2016-09-28 DIAGNOSIS — J449 Chronic obstructive pulmonary disease, unspecified: Secondary | ICD-10-CM | POA: Insufficient documentation

## 2016-09-28 DIAGNOSIS — F17210 Nicotine dependence, cigarettes, uncomplicated: Secondary | ICD-10-CM | POA: Insufficient documentation

## 2016-09-28 MED ORDER — CEPHALEXIN 500 MG PO CAPS: ORAL_CAPSULE | ORAL | 0 refills | Status: DC

## 2016-09-28 MED ORDER — LIDOCAINE-EPINEPHRINE (PF) 2 %-1:200000 IJ SOLN
20.0000 mL | Freq: Once | INTRAMUSCULAR | Status: AC
  Administered 2016-09-28: 20 mL
  Filled 2016-09-28: qty 20

## 2016-09-28 MED ORDER — TRAMADOL HCL 50 MG PO TABS: 50.0000 mg | ORAL_TABLET | Freq: Four times a day (QID) | ORAL | 0 refills | Status: DC | PRN

## 2016-09-28 MED ORDER — SULFAMETHOXAZOLE-TRIMETHOPRIM 800-160 MG PO TABS: 1.0000 | ORAL_TABLET | Freq: Two times a day (BID) | ORAL | 0 refills | Status: DC

## 2016-09-28 NOTE — Discharge Instructions (Signed)
Contact a health care provider if: You have more redness, swelling, or pain around your abscess. You have more fluid or blood coming from your abscess. Your abscess feels warm to the touch. You have more pus or a bad smell coming from your abscess. You have a fever. You have muscle aches. You have chills or a general ill feeling. Get help right away if: You have severe pain. You see red streaks on your skin spreading away from the abscess.

## 2016-09-28 NOTE — ED Provider Notes (Signed)
[facility] DEPT Provider Note   CSN: [id number] Arrival date & time: 09/28/16  1115     History   Chief Complaint Chief Complaint  Patient presents with  . Abscess    HPI Andres Harding is a 51 y.o. male who presents for Left buttock abscess. 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 He states that the pain and swelling came up all of a sudden yesterday and he developed drainage and pain from that site. He states that it is too tender to sit on. He denies fevers or chills. He is unsure of previous history of MRSA.  HPI  Past Medical History:  Diagnosis Date  . Asthma   . Back pain   . Bronchitis   . Cancer ([place])   . COPD (chronic obstructive pulmonary disease) ([place])   . Emphysema lung ([place])   . Emphysema of lung ([place])   . Emphysema of lung ([place])     There are no active problems to display for this patient.   History reviewed. No pertinent surgical history.     Home Medications    Prior to Admission medications   Medication Sig Start Date End Date Taking? Authorizing Provider  albuterol (PROVENTIL HFA;VENTOLIN HFA) 108 (90 BASE) MCG/ACT inhaler Inhale 2-4 puffs into the lungs every 6 (six) hours as needed for wheezing or shortness of breath.     [provider]  albuterol (PROVENTIL HFA;VENTOLIN HFA) 108 (90 Base) MCG/ACT inhaler Inhale 2 puffs into the lungs every 6 (six) hours as needed for wheezing or shortness of breath. 02/26/16   Gregor Hams, MD  cephALEXin (KEFLEX) 500 MG capsule 2 caps po bid x 7 days 09/28/16   Margarita Mail, PA-C  ciprofloxacin (CILOXAN) 0.3 % ophthalmic ointment 1-2 drops in affected eye every 2 hours while awake for 2 days then every 4 hours while awake for 5 days. 01/16/14   Pisciotta, Elmyra Ricks, PA-C  HYDROcodone-acetaminophen (NORCO/VICODIN) 5-325 MG per tablet Take 2 tablets by mouth every 4 (four) hours as needed. 10/17/13   Leonard Schwartz, MD  ibuprofen  (ADVIL,MOTRIN) 800 MG tablet Take 1 tablet (800 mg total) by mouth 3 (three) times daily. 07/09/16   Fransico Meadow, PA-C  Multiple Vitamin (MULTIVITAMIN WITH MINERALS) TABS tablet Take 1 tablet by mouth daily.    [provider]  ondansetron (ZOFRAN) 4 MG tablet Take 1 tablet (4 mg total) by mouth daily as needed for nausea or vomiting. 02/26/16 02/25/17  Gregor Hams, MD  sulfamethoxazole-trimethoprim (BACTRIM DS,SEPTRA DS) 800-160 MG tablet Take 1 tablet by mouth 2 (two) times daily. 09/28/16   Margarita Mail, PA-C  traMADol (ULTRAM) 50 MG tablet Take 1 tablet (50 mg total) by mouth every 6 (six) hours as needed. 09/28/16   Margarita Mail, PA-C    Family History No family history on file.  Social History Social History  Substance Use Topics  . Smoking status: Current Every Day Smoker    Types: Cigarettes  . Smokeless tobacco: Never Used  . Alcohol use Yes     Comment: occasional     Allergies   Patient has no known allergies.   Review of Systems Review of Systems  Ten systems reviewed and are negative for acute change, except as noted in the HPI.    Physical Exam Updated Vital Signs BP (!) 141/86 (BP Location: Left Arm)   Pulse (!) 106   Temp 98 F (36.7 C) (  Oral)   Resp 16   Ht 6\' 7"  (2.007 m)   Wt 81.6 kg (180 lb)   SpO2 99%   BMI 20.28 kg/m   Physical Exam  Constitutional: He appears well-developed and well-nourished. No distress.  HENT:  Head: Normocephalic and atraumatic.  Eyes: Conjunctivae are normal. No scleral icterus.  Neck: Normal range of motion. Neck supple.  Cardiovascular: Normal rate, regular rhythm and normal heart sounds.   Pulmonary/Chest: Effort normal and breath sounds normal. No respiratory distress.  Abdominal: Soft. There is no tenderness.  Musculoskeletal: He exhibits no edema.  Neurological: He is alert.  Skin: Skin is warm and dry. He is not diaphoretic.  Right buttock with about 10 cm of erythema, central induration  with ulceration of the tissue, no active drainage at this time exquisitely tender to palpation. Multiple small erythematous pustules on the bilateral cheeks suggestive of staph infection  Psychiatric: His behavior is normal.  Nursing note and vitals reviewed.    ED Treatments / Results  Labs (all labs ordered are listed, but only abnormal results are displayed) Labs Reviewed  AEROBIC CULTURE (SUPERFICIAL SPECIMEN)    EKG  EKG Interpretation None       Radiology No results found.  Procedures Procedures (including critical care time) INCISION AND DRAINAGE Performed by: Margarita Mail Consent: Verbal consent obtained. Risks and benefits: risks, benefits and alternatives were discussed Type: abscess  Body area: Left buttcok  Anesthesia: local infiltration  Incision was made with a scalpel.  Local anesthetic: lidocaine 2% w/ epinephrine  Anesthetic total: 8 ml  Complexity: complex Blunt dissection to break up loculations  Drainage: purulent  Drainage amount: copious  Packing material: 1/4 in iodoform gauze  Patient tolerance: Patient tolerated the procedure well with no immediate complications.  &` Medications Ordered in ED Medications  lidocaine-EPINEPHrine (XYLOCAINE W/EPI) 2 %-1:200000 (PF) injection 20 mL (20 mLs Infiltration Given 09/28/16 1146)     Initial Impression / Assessment and Plan / ED Course  I have reviewed the triage vital signs and the nursing notes.  Pertinent labs & imaging results that were available during my care of the patient were reviewed by me and considered in my medical decision making (see chart for details).     Patient with left buttock abscess. Wound and sent for culture. Treat with Keflex and Bactrim. Patient abscess wound packed with quarter inch iodoform gauze. He is return in 2 days for wound recheck and packing removal. Discussed return precautions.  Final Clinical Impressions(s) / ED Diagnoses   Final diagnoses:    Left buttock abscess    New Prescriptions New Prescriptions   CEPHALEXIN (KEFLEX) 500 MG CAPSULE    2 caps po bid x 7 days   SULFAMETHOXAZOLE-TRIMETHOPRIM (BACTRIM DS,SEPTRA DS) 800-160 MG TABLET    Take 1 tablet by mouth 2 (two) times daily.   TRAMADOL (ULTRAM) 50 MG TABLET    Take 1 tablet (50 mg total) by mouth every 6 (six) hours as needed.     Margarita Mail, PA-C 09/28/16 [place], DO 09/28/16 1501

## 2016-09-28 NOTE — ED Triage Notes (Signed)
Pt reports abscess to left buttocks since yesterday, started drainage, but is still hard. Denies fever or chills.

## 2016-09-30 ENCOUNTER — Emergency Department (HOSPITAL_COMMUNITY)
Admission: EM | Admit: 2016-09-30 | Discharge: 2016-09-30 | Discharge status: Against Medical Advice | Payer: Self-pay | Attending: Emergency Medicine | Admitting: Emergency Medicine

## 2016-09-30 DIAGNOSIS — Z5321 Procedure and treatment not carried out due to patient leaving prior to being seen by health care provider: Secondary | ICD-10-CM | POA: Insufficient documentation

## 2016-09-30 LAB — AEROBIC CULTURE  (SUPERFICIAL SPECIMEN)

## 2016-09-30 LAB — AEROBIC CULTURE W GRAM STAIN (SUPERFICIAL SPECIMEN)

## 2016-09-30 NOTE — ED Notes (Signed)
This RN went in to triage patient he said he could not wait he had to go to a job interview and was leaving. This rn encouraged patient to stay and he refused.

## 2016-10-01 ENCOUNTER — Telehealth: Payer: Self-pay

## 2016-10-01 NOTE — Telephone Encounter (Signed)
Post ED Visit - Positive Culture Follow-up  Culture report reviewed by antimicrobial stewardship pharmacist:  []  Elenor Quinones, Pharm.D. []  Heide Guile, Pharm.D., BCPS AQ-ID []  Parks Neptune, Pharm.D., BCPS [x]  Alycia Rossetti, Pharm.D., BCPS []  Hernandez, Pharm.D., BCPS, AAHIVP []  Legrand Como, Pharm.D., BCPS, AAHIVP []  Salome Arnt, PharmD, BCPS []  Dimitri Ped, PharmD, BCPS []  Vincenza Hews, PharmD, BCPS  Positive urine culture Treated with Cephalexin and Sulfamethoxazole, organism sensitive to the same and no further patient follow-up is required at this time.  Genia Del 10/01/2016, 9:30 AM

## 2017-05-13 ENCOUNTER — Encounter (HOSPITAL_COMMUNITY): Payer: Self-pay | Admitting: Emergency Medicine

## 2017-05-13 ENCOUNTER — Other Ambulatory Visit: Payer: Self-pay

## 2017-05-13 ENCOUNTER — Emergency Department (HOSPITAL_COMMUNITY)
Admission: EM | Admit: 2017-05-13 | Discharge: 2017-05-13 | Disposition: A | Discharge status: Home / Self Care | Payer: Self-pay | Attending: Emergency Medicine | Admitting: Emergency Medicine

## 2017-05-13 DIAGNOSIS — R369 Urethral discharge, unspecified: Secondary | ICD-10-CM | POA: Insufficient documentation

## 2017-05-13 DIAGNOSIS — F17210 Nicotine dependence, cigarettes, uncomplicated: Secondary | ICD-10-CM | POA: Insufficient documentation

## 2017-05-13 DIAGNOSIS — J449 Chronic obstructive pulmonary disease, unspecified: Secondary | ICD-10-CM | POA: Insufficient documentation

## 2017-05-13 DIAGNOSIS — Z79899 Other long term (current) drug therapy: Secondary | ICD-10-CM | POA: Insufficient documentation

## 2017-05-13 LAB — URINALYSIS, ROUTINE W REFLEX MICROSCOPIC
BACTERIA UA: NONE SEEN
Bilirubin Urine: NEGATIVE
Glucose, UA: NEGATIVE mg/dL
Hgb urine dipstick: NEGATIVE
Ketones, ur: NEGATIVE mg/dL
NITRITE: NEGATIVE
PROTEIN: NEGATIVE mg/dL
SPECIFIC GRAVITY, URINE: 1.026 (ref 1.005–1.030)
pH: 5 (ref 5.0–8.0)

## 2017-05-13 LAB — GC/CHLAMYDIA PROBE AMP (~~LOC~~) NOT AT ARMC
CHLAMYDIA, DNA PROBE: NEGATIVE
NEISSERIA GONORRHEA: POSITIVE — AB

## 2017-05-13 LAB — RPR: RPR Ser Ql: NONREACTIVE

## 2017-05-13 LAB — HIV ANTIBODY (ROUTINE TESTING W REFLEX): HIV Screen 4th Generation wRfx: NONREACTIVE

## 2017-05-13 MED ORDER — ALBUTEROL SULFATE HFA 108 (90 BASE) MCG/ACT IN AERS: 2.0000 | INHALATION_SPRAY | Freq: Four times a day (QID) | RESPIRATORY_TRACT | 0 refills | Status: DC | PRN

## 2017-05-13 MED ORDER — BACITRACIN ZINC 500 UNIT/GM EX OINT
TOPICAL_OINTMENT | Freq: Once | CUTANEOUS | Status: AC
  Administered 2017-05-13: 1 via TOPICAL
  Filled 2017-05-13: qty 0.9

## 2017-05-13 MED ORDER — CEFTRIAXONE SODIUM 250 MG IJ SOLR
250.0000 mg | Freq: Once | INTRAMUSCULAR | Status: AC
  Administered 2017-05-13: 250 mg via INTRAMUSCULAR
  Filled 2017-05-13: qty 250

## 2017-05-13 MED ORDER — STERILE WATER FOR INJECTION IJ SOLN
INTRAMUSCULAR | Status: AC
  Administered 2017-05-13: 1 mL
  Filled 2017-05-13: qty 10

## 2017-05-13 MED ORDER — AZITHROMYCIN 250 MG PO TABS
1000.0000 mg | ORAL_TABLET | Freq: Once | ORAL | Status: AC
  Administered 2017-05-13: 1000 mg via ORAL
  Filled 2017-05-13: qty 4

## 2017-05-13 NOTE — Discharge Instructions (Addendum)
You were treated for gonorrhea and chlamydia today. If the results are positive, you do not need further treatment. If the results for HIV, syphilis, or hepatitis are positive, you will need treatment. You can follow up with the health department.  Keep the area on your testicle clean. Follow up with [facility] to establish primary care.  Follow up with the health department for any future concerns about STDs, and they can test and treat for free.  Return to the ER if you develop sever testicular pain, fevers, vomiting, or any new or concerning symptoms.

## 2017-05-13 NOTE — ED Provider Notes (Signed)
[facility] EMERGENCY DEPARTMENT Provider Note   CSN: [id number] Arrival date & time: 05/13/17  0410     History   Chief Complaint Chief Complaint  Patient presents with  . SEXUALLY TRANSMITTED DISEASE    HPI Andres Harding is a 52 y.o. male presenting for evaluation of penile discharge.  Patient states for the past week, he has had whitish-greenish penile discharge.  He states that he is sexually active with multiple partners and does not use protection.  His partners are both male and male.  He states he has had gonorrhea and chlamydia in the past, and this is similar.  He denies penile pain or irritation.  He denies urinary symptoms including dysuria, hematuria, urinary frequency.  He reports a lesion on his right testicle which is consistent with an ingrown hair.  Otherwise denies pain or swelling of the testicles.  He is requesting a full STD work-up including HIV, syphilis, and hepatitis.  He denies fevers, chills, nausea, vomiting, abdominal pain.  He is unsure if his partners have symptoms.  HPI  Past Medical History:  Diagnosis Date  . Asthma   . Back pain   . Bronchitis   . Cancer ([place])   . COPD (chronic obstructive pulmonary disease) ([place])   . Emphysema lung ([place])   . Emphysema of lung ([place])   . Emphysema of lung ([place])     There are no active problems to display for this patient.   History reviewed. No pertinent surgical history.      Home Medications    Prior to Admission medications   Medication Sig Start Date End Date Taking? Authorizing Provider  albuterol (PROVENTIL HFA;VENTOLIN HFA) 108 (90 Base) MCG/ACT inhaler Inhale 2 puffs into the lungs every 6 (six) hours as needed for wheezing or shortness of breath. 02/26/16   Gregor Hams, MD  albuterol (PROVENTIL HFA;VENTOLIN HFA) 108 (90 Base) MCG/ACT inhaler Inhale 2-4 puffs into the lungs every 6 (six) hours as needed for wheezing or shortness of breath. 05/13/17   Icess Bertoni,  Jubal Rademaker, PA-C  cephALEXin (KEFLEX) 500 MG capsule 2 caps po bid x 7 days 09/28/16   Margarita Mail, PA-C  ciprofloxacin (CILOXAN) 0.3 % ophthalmic ointment 1-2 drops in affected eye every 2 hours while awake for 2 days then every 4 hours while awake for 5 days. 01/16/14   Pisciotta, Elmyra Ricks, PA-C  HYDROcodone-acetaminophen (NORCO/VICODIN) 5-325 MG per tablet Take 2 tablets by mouth every 4 (four) hours as needed. 10/17/13   Leonard Schwartz, MD  ibuprofen (ADVIL,MOTRIN) 800 MG tablet Take 1 tablet (800 mg total) by mouth 3 (three) times daily. 07/09/16   Fransico Meadow, PA-C  Multiple Vitamin (MULTIVITAMIN WITH MINERALS) TABS tablet Take 1 tablet by mouth daily.    [provider]  sulfamethoxazole-trimethoprim (BACTRIM DS,SEPTRA DS) 800-160 MG tablet Take 1 tablet by mouth 2 (two) times daily. 09/28/16   Margarita Mail, PA-C  traMADol (ULTRAM) 50 MG tablet Take 1 tablet (50 mg total) by mouth every 6 (six) hours as needed. 09/28/16   Margarita Mail, PA-C    Family History No family history on file.  Social History Social History   Tobacco Use  . Smoking status: Current Every Day Smoker    Types: Cigarettes  . Smokeless tobacco: Never Used  Substance Use Topics  . Alcohol use: Yes    Comment: occasional  . Drug use: Yes    Types: Methamphetamines     Allergies   Patient has no known  allergies.   Review of Systems Review of Systems  Constitutional: Negative for chills and fever.  Genitourinary: Positive for discharge. Negative for dysuria, frequency, hematuria, penile pain, penile swelling and testicular pain.     Physical Exam Updated Vital Signs BP (!) 136/91   Pulse 92   Temp 98.6 F (37 C) (Oral)   Resp 16   Ht 6\' 7"  (2.007 m)   Wt 88.5 kg (195 lb)   SpO2 98%   BMI 21.97 kg/m   Physical Exam  Constitutional: He is oriented to person, place, and time. He appears well-developed and well-nourished. No distress.  HENT:  Head: Normocephalic and atraumatic.    Eyes: EOM are normal.  Neck: Normal range of motion.  Cardiovascular: Normal rate, regular rhythm and intact distal pulses.  Pulmonary/Chest: Effort normal and breath sounds normal. No respiratory distress. He has no wheezes.  Abdominal: Soft. He exhibits no distension. There is no tenderness. Hernia confirmed negative in the right inguinal area and confirmed negative in the left inguinal area.  Genitourinary: Testes normal and penis normal. Right testis shows no swelling and no tenderness. Right testis is descended. Left testis shows no swelling and no tenderness. Left testis is descended. Circumcised.  Genitourinary Comments: Chaperone present.  White discharge noted at the tip of the penis.  Superficial nonpainful lesion on the posterior R testicle without active drainage no testicular swelling or redness.  No lymphadenopathy.  Musculoskeletal: Normal range of motion.  Lymphadenopathy: No inguinal adenopathy noted on the right or left side.  Neurological: He is alert and oriented to person, place, and time.  Skin: Skin is warm. No rash noted.  Psychiatric: He has a normal mood and affect.  Nursing note and vitals reviewed.    ED Treatments / Results  Labs (all labs ordered are listed, but only abnormal results are displayed) Labs Reviewed  URINALYSIS, ROUTINE W REFLEX MICROSCOPIC - Abnormal; Notable for the following components:      Result Value   Leukocytes, UA SMALL (*)    All other components within normal limits  HEPATITIS PANEL, ACUTE  RPR  HIV ANTIBODY (ROUTINE TESTING)  GC/CHLAMYDIA PROBE AMP ([place]) NOT AT [facility]    EKG None  Radiology No results found.  Procedures Procedures (including critical care time)  Medications Ordered in ED Medications  cefTRIAXone (ROCEPHIN) injection 250 mg (250 mg Intramuscular Given 05/13/17 0604)  azithromycin (ZITHROMAX) tablet 1,000 mg (1,000 mg Oral Given 05/13/17 0605)  bacitracin ointment (1 application Topical Given  05/13/17 0604)  sterile water (preservative free) injection (1 mL  Given 05/13/17 0604)     Initial Impression / Assessment and Plan / ED Course  I have reviewed the triage vital signs and the nursing notes.  Pertinent labs & imaging results that were available during my care of the patient were reviewed by me and considered in my medical decision making (see chart for details).     Patient presenting for evaluation of penile discharge.  Physical exam shows whitish penile discharge with painless lesion on posterior right testicle.  Concern for syphilis.  As patient has discharge, will treat with antibiotics.  Patient is aware that his results are pending.  He is aware that he will need to follow-up with health department if anything besides gonorrhea and chlamydia is positive.  Discussed importance of safe sex practices. Patient also requesting refill of albuterol inhaler for COPD/asthma.  No wheezing at this time.  Patient instructed that he needs to find a primary care doctor  for management of his chronic conditions, but I will refill of his inhaler this time.  At this time, patient appears safe for discharge.  Return precautions given.  Patient states he understands and agrees to plan.  Final Clinical Impressions(s) / ED Diagnoses   Final diagnoses:  Penile discharge    ED Discharge Orders        Ordered    albuterol (PROVENTIL HFA;VENTOLIN HFA) 108 (90 Base) MCG/ACT inhaler  Every 6 hours PRN     05/13/17 0547       Franchot [place], PA-C 02/54/27 0623    Delora Fuel, MD 76/28/31 [phone]

## 2017-05-13 NOTE — ED Triage Notes (Addendum)
Pt c/o penile discharge x 1 week. Pt requesting "full STD work up".

## 2017-05-14 LAB — HEPATITIS PANEL, ACUTE
HEP A IGM: NEGATIVE
Hep B C IgM: NEGATIVE
Hepatitis B Surface Ag: NEGATIVE

## 2017-10-03 ENCOUNTER — Other Ambulatory Visit: Payer: Self-pay

## 2017-10-03 ENCOUNTER — Emergency Department (HOSPITAL_COMMUNITY)
Admission: EM | Admit: 2017-10-03 | Discharge: 2017-10-03 | Disposition: A | Discharge status: Home / Self Care | Payer: Self-pay | Attending: Emergency Medicine | Admitting: Emergency Medicine

## 2017-10-03 ENCOUNTER — Encounter (HOSPITAL_COMMUNITY): Payer: Self-pay | Admitting: Emergency Medicine

## 2017-10-03 DIAGNOSIS — F17210 Nicotine dependence, cigarettes, uncomplicated: Secondary | ICD-10-CM | POA: Insufficient documentation

## 2017-10-03 DIAGNOSIS — E860 Dehydration: Secondary | ICD-10-CM | POA: Insufficient documentation

## 2017-10-03 DIAGNOSIS — R197 Diarrhea, unspecified: Secondary | ICD-10-CM | POA: Insufficient documentation

## 2017-10-03 DIAGNOSIS — J449 Chronic obstructive pulmonary disease, unspecified: Secondary | ICD-10-CM | POA: Insufficient documentation

## 2017-10-03 LAB — COMPREHENSIVE METABOLIC PANEL
ALK PHOS: 59 U/L (ref 38–126)
ALT: 25 U/L (ref 0–44)
AST: 22 U/L (ref 15–41)
Albumin: 3.4 g/dL — ABNORMAL LOW (ref 3.5–5.0)
Anion gap: 13 (ref 5–15)
BILIRUBIN TOTAL: 0.4 mg/dL (ref 0.3–1.2)
BUN: 14 mg/dL (ref 6–20)
CALCIUM: 8.4 mg/dL — AB (ref 8.9–10.3)
CO2: 23 mmol/L (ref 22–32)
Chloride: 98 mmol/L (ref 98–111)
Creatinine, Ser: 1.03 mg/dL (ref 0.61–1.24)
GFR calc Af Amer: >60 mL/min (ref >60)
GLUCOSE: 143 mg/dL — AB (ref 70–99)
POTASSIUM: 3.4 mmol/L — AB (ref 3.5–5.1)
Sodium: 134 mmol/L — ABNORMAL LOW (ref 135–145)
TOTAL PROTEIN: 6.1 g/dL — AB (ref 6.5–8.1)

## 2017-10-03 LAB — CBC
HEMATOCRIT: 45.8 % (ref 39.0–52.0)
Hemoglobin: 14.7 g/dL (ref 13.0–17.0)
MCH: 28.2 pg (ref 26.0–34.0)
MCHC: 32.1 g/dL (ref 30.0–36.0)
MCV: 87.7 fL (ref 78.0–100.0)
PLATELETS: 256 10*3/uL (ref 150–400)
RBC: 5.22 MIL/uL (ref 4.22–5.81)
RDW: 14.1 % (ref 11.5–15.5)
WBC: 5.5 10*3/uL (ref 4.0–10.5)

## 2017-10-03 LAB — URINALYSIS, ROUTINE W REFLEX MICROSCOPIC
Bilirubin Urine: NEGATIVE
GLUCOSE, UA: NEGATIVE mg/dL
Hgb urine dipstick: NEGATIVE
Ketones, ur: NEGATIVE mg/dL
LEUKOCYTES UA: NEGATIVE
NITRITE: NEGATIVE
Protein, ur: NEGATIVE mg/dL
Specific Gravity, Urine: 1.017 (ref 1.005–1.030)
pH: 5 (ref 5.0–8.0)

## 2017-10-03 LAB — LIPASE, BLOOD: Lipase: 25 U/L (ref 11–51)

## 2017-10-03 MED ORDER — ALBUTEROL SULFATE HFA 108 (90 BASE) MCG/ACT IN AERS
2.0000 | INHALATION_SPRAY | RESPIRATORY_TRACT | Status: DC | PRN
  Filled 2017-10-03: qty 6.7

## 2017-10-03 MED ORDER — LACTATED RINGERS IV BOLUS
2000.0000 mL | Freq: Once | INTRAVENOUS | Status: AC
Start: 2017-10-03 — End: 2017-10-03
  Administered 2017-10-03: 2000 mL via INTRAVENOUS

## 2017-10-03 NOTE — ED Notes (Signed)
Pt refusing to leave... Pt to be escorted out by security.

## 2017-10-03 NOTE — ED Provider Notes (Signed)
Patient placed in [facility] pathway, seen and evaluated   Chief Complaint: Diarrhea  HPI:   TAB RYLEE is a 52 y.o. male who presents to ED for diarrhea x 4-5 days. Approx. 5 episodes per day. Has had blood in his stool before, but none in the last 4-5 days. No recent travel. No known sick contacts.   ROS: + diarrhea  - Abdominal pain, n/v  - Fever  Physical Exam:   Gen: No distress  Neuro: Awake and Alert  Skin: Warm    Focused Exam: No abdominal tenderness. Lungs CTA bilaterally.    Initiation of care has begun. The patient has been counseled on the process, plan, and necessity for staying for the completion/evaluation, and the remainder of the medical screening examination   Ward, Ozella Almond, PA-C 10/03/17 [place], MD 10/04/17 [phone]
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
## 2017-10-03 NOTE — ED Triage Notes (Signed)
Pt to ER for evaluation of 4-5 days diarrhea without abdominal pain. Reports chills, no measured temperatures. Reports hx of asthma and also needs a new inhaler.

## 2017-10-03 NOTE — ED Notes (Signed)
E-signature not available, pt verbalized understanding of DC instructions  

## 2018-08-26 ENCOUNTER — Other Ambulatory Visit: Payer: Self-pay

## 2018-08-26 ENCOUNTER — Emergency Department
Admission: EM | Admit: 2018-08-26 | Discharge: 2018-08-26 | Disposition: A | Discharge status: Home / Self Care | Payer: Self-pay | Attending: Emergency Medicine | Admitting: Emergency Medicine

## 2018-08-26 ENCOUNTER — Encounter: Payer: Self-pay | Admitting: Emergency Medicine

## 2018-08-26 DIAGNOSIS — F1510 Other stimulant abuse, uncomplicated: Secondary | ICD-10-CM | POA: Insufficient documentation

## 2018-08-26 DIAGNOSIS — Z859 Personal history of malignant neoplasm, unspecified: Secondary | ICD-10-CM | POA: Insufficient documentation

## 2018-08-26 DIAGNOSIS — R4182 Altered mental status, unspecified: Secondary | ICD-10-CM | POA: Insufficient documentation

## 2018-08-26 DIAGNOSIS — J449 Chronic obstructive pulmonary disease, unspecified: Secondary | ICD-10-CM | POA: Insufficient documentation

## 2018-08-26 DIAGNOSIS — F17210 Nicotine dependence, cigarettes, uncomplicated: Secondary | ICD-10-CM | POA: Insufficient documentation

## 2018-08-26 NOTE — ED Notes (Signed)
Pt dressed out. Belongings include: orange shirt, jeans, socks, brown shoes, belt. Pt denies any valuables at this time.
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
## 2018-08-26 NOTE — ED Notes (Signed)
Pt discharged home. VS stable. All belongings returned to patient. Discharge instructions reviewed with patient. Patient signed for discharge. Pt denies pain, SI/HI.
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
## 2018-08-26 NOTE — Discharge Instructions (Addendum)
Return to the ER for any acute medical symptoms that concern you such as worsening shortness of breath, weakness or lightheadedness, fever, vomiting, or any other new or worsening symptoms.

## 2018-08-26 NOTE — ED Notes (Signed)
Urine obtained and sent to lab by this tech

## 2019-01-06 ENCOUNTER — Telehealth: Payer: Self-pay

## 2019-12-31 ENCOUNTER — Emergency Department (HOSPITAL_COMMUNITY): Payer: Medicaid Other

## 2019-12-31 ENCOUNTER — Emergency Department (HOSPITAL_COMMUNITY)
Admission: EM | Admit: 2019-12-31 | Discharge: 2020-01-01 | Disposition: A | Discharge status: Home / Self Care | Payer: Medicaid Other | Attending: Emergency Medicine | Admitting: Emergency Medicine

## 2019-12-31 ENCOUNTER — Other Ambulatory Visit: Payer: Self-pay

## 2019-12-31 DIAGNOSIS — Z5321 Procedure and treatment not carried out due to patient leaving prior to being seen by health care provider: Secondary | ICD-10-CM | POA: Insufficient documentation

## 2019-12-31 DIAGNOSIS — T1490XA Injury, unspecified, initial encounter: Secondary | ICD-10-CM

## 2019-12-31 DIAGNOSIS — M79605 Pain in left leg: Secondary | ICD-10-CM | POA: Insufficient documentation

## 2019-12-31 LAB — BASIC METABOLIC PANEL
Anion gap: 6 (ref 5–15)
BUN: 15 mg/dL (ref 6–20)
CO2: 27 mmol/L (ref 22–32)
Calcium: 8.9 mg/dL (ref 8.9–10.3)
Chloride: 104 mmol/L (ref 98–111)
Creatinine, Ser: 1.26 mg/dL — ABNORMAL HIGH (ref 0.61–1.24)
GFR, Estimated: >60 mL/min (ref >60)
Glucose, Bld: 129 mg/dL — ABNORMAL HIGH (ref 70–99)
Potassium: 4 mmol/L (ref 3.5–5.1)
Sodium: 137 mmol/L (ref 135–145)

## 2019-12-31 LAB — CBC
HCT: 43.7 % (ref 39.0–52.0)
Hemoglobin: 14.5 g/dL (ref 13.0–17.0)
MCH: 29.1 pg (ref 26.0–34.0)
MCHC: 33.2 g/dL (ref 30.0–36.0)
MCV: 87.6 fL (ref 80.0–100.0)
Platelets: 218 10*3/uL (ref 150–400)
RBC: 4.99 MIL/uL (ref 4.22–5.81)
RDW: 13.7 % (ref 11.5–15.5)
WBC: 7.9 10*3/uL (ref 4.0–10.5)
nRBC: 0 % (ref 0.0–0.2)

## 2019-12-31 NOTE — ED Triage Notes (Addendum)
Pt presents to ED BIB [facility]. Pt c/o L leg pain. Pt ran into Marketing executive. intially no bleeding. Gash to front of leg. Pt reports warmth, purulent drainage. Pt attempted cleaning by self. Unknown tetanus status EMS VS -  150/100 HR - 110 96% RA

## 2019-12-31 NOTE — ED Notes (Signed)
Per registration, pt left and handed stickers to be shredded

## 2020-03-09 ENCOUNTER — Emergency Department (HOSPITAL_BASED_OUTPATIENT_CLINIC_OR_DEPARTMENT_OTHER): Payer: Self-pay

## 2020-03-09 ENCOUNTER — Encounter (HOSPITAL_COMMUNITY): Payer: Self-pay | Admitting: Emergency Medicine

## 2020-03-09 ENCOUNTER — Emergency Department (HOSPITAL_COMMUNITY)
Admission: EM | Admit: 2020-03-09 | Discharge: 2020-03-09 | Disposition: A | Discharge status: Home / Self Care | Payer: Self-pay | Attending: Emergency Medicine | Admitting: Emergency Medicine

## 2020-03-09 ENCOUNTER — Other Ambulatory Visit: Payer: Self-pay

## 2020-03-09 DIAGNOSIS — Z859 Personal history of malignant neoplasm, unspecified: Secondary | ICD-10-CM | POA: Insufficient documentation

## 2020-03-09 DIAGNOSIS — L03116 Cellulitis of left lower limb: Secondary | ICD-10-CM | POA: Insufficient documentation

## 2020-03-09 DIAGNOSIS — R000 Tachycardia, unspecified: Secondary | ICD-10-CM | POA: Insufficient documentation

## 2020-03-09 DIAGNOSIS — L02416 Cutaneous abscess of left lower limb: Secondary | ICD-10-CM | POA: Insufficient documentation

## 2020-03-09 DIAGNOSIS — J45909 Unspecified asthma, uncomplicated: Secondary | ICD-10-CM | POA: Insufficient documentation

## 2020-03-09 DIAGNOSIS — L539 Erythematous condition, unspecified: Secondary | ICD-10-CM

## 2020-03-09 DIAGNOSIS — M79609 Pain in unspecified limb: Secondary | ICD-10-CM

## 2020-03-09 DIAGNOSIS — J449 Chronic obstructive pulmonary disease, unspecified: Secondary | ICD-10-CM | POA: Insufficient documentation

## 2020-03-09 DIAGNOSIS — M7989 Other specified soft tissue disorders: Secondary | ICD-10-CM

## 2020-03-09 DIAGNOSIS — F17210 Nicotine dependence, cigarettes, uncomplicated: Secondary | ICD-10-CM | POA: Insufficient documentation

## 2020-03-09 DIAGNOSIS — R6883 Chills (without fever): Secondary | ICD-10-CM | POA: Insufficient documentation

## 2020-03-09 LAB — URINALYSIS, ROUTINE W REFLEX MICROSCOPIC
Bilirubin Urine: NEGATIVE
Glucose, UA: NEGATIVE mg/dL
Hgb urine dipstick: NEGATIVE
Ketones, ur: NEGATIVE mg/dL
Leukocytes,Ua: NEGATIVE
Nitrite: NEGATIVE
Protein, ur: NEGATIVE mg/dL
Specific Gravity, Urine: 1.017 (ref 1.005–1.030)
pH: 5 (ref 5.0–8.0)

## 2020-03-09 LAB — CBC WITH DIFFERENTIAL/PLATELET
Abs Immature Granulocytes: 0.02 10*3/uL (ref 0.00–0.07)
Basophils Absolute: 0 10*3/uL (ref 0.0–0.1)
Basophils Relative: 0 %
Eosinophils Absolute: 0.1 10*3/uL (ref 0.0–0.5)
Eosinophils Relative: 1 %
HCT: 37 % — ABNORMAL LOW (ref 39.0–52.0)
Hemoglobin: 11.9 g/dL — ABNORMAL LOW (ref 13.0–17.0)
Immature Granulocytes: 0 %
Lymphocytes Relative: 12 %
Lymphs Abs: 0.9 10*3/uL (ref 0.7–4.0)
MCH: 28.4 pg (ref 26.0–34.0)
MCHC: 32.2 g/dL (ref 30.0–36.0)
MCV: 88.3 fL (ref 80.0–100.0)
Monocytes Absolute: 0.8 10*3/uL (ref 0.1–1.0)
Monocytes Relative: 10 %
Neutro Abs: 5.9 10*3/uL (ref 1.7–7.7)
Neutrophils Relative %: 77 %
Platelets: 228 10*3/uL (ref 150–400)
RBC: 4.19 MIL/uL — ABNORMAL LOW (ref 4.22–5.81)
RDW: 13.4 % (ref 11.5–15.5)
WBC: 7.7 10*3/uL (ref 4.0–10.5)
nRBC: 0 % (ref 0.0–0.2)

## 2020-03-09 LAB — COMPREHENSIVE METABOLIC PANEL
ALT: 17 U/L (ref 0–44)
AST: 21 U/L (ref 15–41)
Albumin: 3.6 g/dL (ref 3.5–5.0)
Alkaline Phosphatase: 50 U/L (ref 38–126)
Anion gap: 9 (ref 5–15)
BUN: 23 mg/dL — ABNORMAL HIGH (ref 6–20)
CO2: 21 mmol/L — ABNORMAL LOW (ref 22–32)
Calcium: 8.5 mg/dL — ABNORMAL LOW (ref 8.9–10.3)
Chloride: 104 mmol/L (ref 98–111)
Creatinine, Ser: 1.4 mg/dL — ABNORMAL HIGH (ref 0.61–1.24)
GFR, Estimated: 59 mL/min — ABNORMAL LOW (ref >60)
Glucose, Bld: 128 mg/dL — ABNORMAL HIGH (ref 70–99)
Potassium: 4.1 mmol/L (ref 3.5–5.1)
Sodium: 134 mmol/L — ABNORMAL LOW (ref 135–145)
Total Bilirubin: 0.6 mg/dL (ref 0.3–1.2)
Total Protein: 6.7 g/dL (ref 6.5–8.1)

## 2020-03-09 LAB — LACTIC ACID, PLASMA
Lactic Acid, Venous: 1 mmol/L (ref 0.5–1.9)
Lactic Acid, Venous: 1.6 mmol/L (ref 0.5–1.9)

## 2020-03-09 MED ORDER — DEXTROSE 5 % IV SOLN
1500.0000 mg | Freq: Once | INTRAVENOUS | Status: AC
  Administered 2020-03-09: 1500 mg via INTRAVENOUS
  Filled 2020-03-09: qty 75

## 2020-03-09 NOTE — Discharge Instructions (Signed)
If in the next 48 hours the redness seems to be worsening you start having high fever, vomiting or feeling very ill please return to the emergency room.  The antibiotic infusion you received here should remain in your system for 7 to 10 days and you do not need any other oral antibiotics.  Keep your leg covered while at work

## 2020-03-09 NOTE — ED Notes (Signed)
Pt given urinal and informed that we need a urine sample. Pt states he will hit the call bell when he has given a sample.

## 2020-03-09 NOTE — Progress Notes (Signed)
Pharmacy Note:  [facility] for Acute Bacterial Skin and Skin Structure Infection (ABSSSI) Patients to [facility] Discharge Andres Harding is an 55 y.o. male who presented to [facility] on 03/09/2020 with an Acute Bacterial Skin and Skin Structure Infection  Inclusion criteria - Indication []  Moderately large skin lesion (>=75 cm2 or larger - about the size of a baseball) [x]  Cellulitis  Inclusion Criteria - at least one SIRS criteria present []  WBC > 12,000 or < 4000 []  temp >100.9 or < 96.8 [x]  heart rate >90[]  respiratory rate >20  Patient was evaluated for the following exclusion criteria and no exclusions were found  Hardware involvement, Hypotension / shock, Elevated lactate (>2) without other explanation, ram-negative infection risk factors (bites, water exposure, infection after trauma, infection after skin graft, neutropenia, burns, severe immunocompromise), necrotizing fasciitis possible or confirmed, Known or suspected osteomyelitis or septic arthritis, endocarditis, diabetic foot infection, ischemic ulcers, post-operative wound infection, perirectal infections, need for drainage in the operating room, hand or facial infections, injection drug users with a fever, bacteremia, pregnancy or breastfeeding, allergy to related antibiotics like vancomycin, known liver disease (t.bili >2x ULN or AST/ALT 3x ULN)  Kara Mead 03/09/2020, 7:33 PM Clinical Pharmacist

## 2020-03-09 NOTE — Progress Notes (Signed)
VASCULAR LAB    Left lower extremity venous duplex has been performed.  See CV proc for preliminary results.  Gave verbal results to Dr. Milus Banister, [facility], RVT 03/09/2020, 6:44 PM

## 2020-03-09 NOTE — ED Provider Notes (Signed)
[facility] DEPT Provider Note   CSN: [id number] Arrival date & time: 03/09/20  1638     History Chief Complaint  Patient presents with  . Leg Pain    Andres Harding is a 55 y.o. male.  The history is provided by the patient.  Leg Pain Location:  Leg Injury: yes   Mechanism of injury comment:  A board his his knee a few days before symptoms while at work Leg location:  L leg Pain details:    Quality:  Throbbing and shooting   Radiates to:  Groin   Severity:  Moderate   Onset quality:  Gradual   Duration:  2 days   Timing:  Constant   Progression:  Worsening Chronicity:  Recurrent Foreign body present:  No foreign bodies Tetanus status:  Unknown Prior injury to area:  No Relieved by:  Nothing Worsened by:  Flexion, extension and bearing weight Ineffective treatments:  None tried Associated symptoms: swelling   Associated symptoms: no fever   Associated symptoms comment:  Chills.  Redness to the left leg with streaking up to the groin and swelling and redness in the left lower leg.  He reports an area on the medial portion of his left knee that started draining today purulent yellow drainage.  Patient reports in December he had hit in his left shin on a trailer hitch and reported it initially bruised badly but then it started draining pus.  He did go to the emergency room but waited for 15 hours and then left without being seen.  He treated it with localized wound care at home including peroxide soap and water.  He reports it eventually healed up. 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 Asthma   . Back pain   . Bronchitis   . Cancer ([place])   . COPD (chronic obstructive pulmonary disease) ([place])   . Emphysema lung ([place])   . Emphysema of lung ([place])   . Emphysema of lung ([place])     There are no problems to display for this  patient.   History reviewed. No pertinent surgical history.     History reviewed. No pertinent family history.  Social History   Tobacco Use  . Smoking status: Current Every Day Smoker    Packs/day: 0.80    Types: Cigarettes  . Smokeless tobacco: Never Used  Substance Use Topics  . Alcohol use: Yes    Comment: occasional  . Drug use: Yes    Types: Methamphetamines, IV    Comment: heroin; crystal meth    Home Medications Prior to Admission medications   Medication Sig Start Date End Date Taking? Authorizing Provider  albuterol (PROVENTIL HFA;VENTOLIN HFA) 108 (90 Base) MCG/ACT inhaler Inhale 2 puffs into the lungs every 6 (six) hours as needed for wheezing or shortness of breath. 02/26/16   Gregor Hams, MD  albuterol (PROVENTIL HFA;VENTOLIN HFA) 108 (90 Base) MCG/ACT inhaler Inhale 2-4 puffs into the lungs every 6 (six) hours as needed for wheezing or shortness of breath. Patient not taking: Reported on 10/03/2017 05/13/17   Caccavale, Sophia, PA-C  HYDROcodone-acetaminophen (NORCO/VICODIN) 5-325 MG per tablet Take 2 tablets by mouth every 4 (four) hours as needed. Patient not taking: Reported on 10/03/2017 10/17/13   Leonard Schwartz, MD  ibuprofen (ADVIL,MOTRIN) 800 MG tablet Take 1 tablet (800 mg total) by mouth  3 (three) times daily. Patient not taking: Reported on 10/03/2017 07/09/16   Fransico Meadow, PA-C  traMADol (ULTRAM) 50 MG tablet Take 1 tablet (50 mg total) by mouth every 6 (six) hours as needed. Patient not taking: Reported on 10/03/2017 09/28/16   Margarita Mail, PA-C    Allergies    Patient has no known allergies.  Review of Systems   Review of Systems  Constitutional: Negative for fever.  All other systems reviewed and are negative.   Physical Exam Updated Vital Signs BP (!) 158/106 (BP Location: Right Arm)   Pulse (!) 112   Temp 98.3 F (36.8 C) (Oral)   Resp 16   Ht 6\' 7"  (2.007 m)   Wt 81.6 kg   SpO2 98%   BMI 20.28 kg/m   Physical  Exam Vitals and nursing note reviewed.  Constitutional:      General: He is not in acute distress.    Appearance: Normal appearance. He is well-developed, normal weight and well-nourished.  HENT:     Head: Normocephalic and atraumatic.     Mouth/Throat:     Mouth: Oropharynx is clear and moist. Mucous membranes are moist.  Eyes:     Extraocular Movements: EOM normal.     Conjunctiva/sclera: Conjunctivae normal.     Pupils: Pupils are equal, round, and reactive to light.  Cardiovascular:     Rate and Rhythm: Regular rhythm. Tachycardia present.     Pulses: Intact distal pulses.     Heart sounds: No murmur heard.   Pulmonary:     Effort: Pulmonary effort is normal. No respiratory distress.     Breath sounds: Normal breath sounds. No wheezing or rales.  Abdominal:     General: There is no distension.     Palpations: Abdomen is soft.     Tenderness: There is no abdominal tenderness. There is no guarding or rebound.  Musculoskeletal:        General: Tenderness present. No edema. Normal range of motion.     Cervical back: Normal range of motion and neck supple.       Legs:     Comments: Patient has significant tenderness with palpation to the medial tib-fib with edema and erythema and warmth.  No involvement of the ankle or foot on the left leg  Skin:    General: Skin is warm and dry.     Findings: No erythema or rash.  Neurological:     Mental Status: He is alert and oriented to person, place, and time. Mental status is at baseline.  Psychiatric:        Mood and Affect: Mood and affect and mood normal.        Behavior: Behavior normal.        Thought Content: Thought content normal.     ED Results / Procedures / Treatments   Labs (all labs ordered are listed, but only abnormal results are displayed) Labs Reviewed  COMPREHENSIVE METABOLIC PANEL - Abnormal; Notable for the following components:      Result Value   Sodium 134 (*)    CO2 21 (*)    Glucose, Bld 128 (*)     BUN 23 (*)    Creatinine, Ser 1.40 (*)    Calcium 8.5 (*)    GFR, Estimated 59 (*)    All other components within normal limits  CBC WITH DIFFERENTIAL/PLATELET - Abnormal; Notable for the following components:   RBC 4.19 (*)    Hemoglobin 11.9 (*)  HCT 37.0 (*)    All other components within normal limits  LACTIC ACID, PLASMA  LACTIC ACID, PLASMA  URINALYSIS, ROUTINE W REFLEX MICROSCOPIC    EKG None  Radiology VAS [place] LOWER EXTREMITY VENOUS (DVT) ([facility] and [facility] 7a-7p)  Result Date: 03/09/2020  Lower Venous DVT Study Indications: Pain, Swelling, and Erythema.  Comparison Study: No prior studies Performing Technologist: Sharion Dove RVS  Examination Guidelines: A complete evaluation includes B-mode imaging, spectral Doppler, color Doppler, and power Doppler as needed of all accessible portions of each vessel. Bilateral testing is considered an integral part of a complete examination. Limited examinations for reoccurring indications may be performed as noted. The reflux portion of the exam is performed with the patient in reverse Trendelenburg.  +-----+---------------+---------+-----------+----------+--------------+ RIGHTCompressibilityPhasicitySpontaneityPropertiesThrombus Aging +-----+---------------+---------+-----------+----------+--------------+ CFV                 Yes      Yes                                 +-----+---------------+---------+-----------+----------+--------------+   +---------+---------------+---------+-----------+----------+--------------+ LEFT     CompressibilityPhasicitySpontaneityPropertiesThrombus Aging +---------+---------------+---------+-----------+----------+--------------+ CFV      Full           Yes      Yes                                 +---------+---------------+---------+-----------+----------+--------------+ SFJ      Full                                                         +---------+---------------+---------+-----------+----------+--------------+ FV Prox  Full                                                        +---------+---------------+---------+-----------+----------+--------------+ FV Mid   Full                                                        +---------+---------------+---------+-----------+----------+--------------+ FV DistalFull                                                        +---------+---------------+---------+-----------+----------+--------------+ PFV      Full                                                        +---------+---------------+---------+-----------+----------+--------------+ POP      Full           Yes      Yes                                 +---------+---------------+---------+-----------+----------+--------------+  PTV      Full                                                        +---------+---------------+---------+-----------+----------+--------------+ PERO     Full                                                        +---------+---------------+---------+-----------+----------+--------------+     Summary: RIGHT: - No evidence of common femoral vein obstruction.  LEFT: - There is no evidence of deep vein thrombosis in the lower extremity.  - Ultrasound characteristics of enlarged lymph nodes noted in the groin.  *See table(s) above for measurements and observations.    Preliminary     Procedures Procedures   Medications Ordered in ED Medications - No data to display  ED Course  I have reviewed the triage vital signs and the nursing notes.  Pertinent labs & imaging results that were available during my care of the patient were reviewed by me and considered in my medical decision making (see chart for details).    MDM Rules/Calculators/A&P                          Patient presenting today with symptoms of worsening pain swelling and redness of the left lower extremity.   Patient has draining abscess currently with surrounding cellulitis with streaking up into the groin.  He has had chills but no fever today.  Patient is tachycardic but does have a history of IV meth use which is ongoing and asthma and used his inhaler 30 minutes prior to arrival.  He does not appear systemically ill today.  However he does have significant erythema and swelling of the left lower leg in addition to the streaking of the upper leg.  Patient had an injury approximately 2 months ago to the same leg with bruising swelling and purulent drainage which did improve without intervention.  Concern for abscess and cellulitis however also concern for possible DVT given the extent of leg swelling.  Labs are pending.  Patient reports he does not wish to stay.  Spoke with pharmacist and do feel he may be a good candidate for [facility] as he does have a prior history of MRSA.  We will follow labs and ultrasound to ensure no DVT.  Tetanus shot is UTD.  7:10 PM Patient's DVT study is negative, lactate is within normal limits, CMP creatinine of 1.4 and a GFR of 59 otherwise normal, CBC without acute findings.  Given patient's extent of cellulitis with evidence of abscess, prior MRSA and tachycardia he does meet criteria for Dalvance.  Patient reports he is not willing to stay in the hospital but would be willing to get that infusion.  Spoke with pharmacy and patient is a candidate.  Will infuse and then feel that patient is stable for discharge.  9:45 PM Pt tolerated the infusion well and will d/c home with ID f/u.  MDM Number of Diagnoses or Management Options   Amount and/or Complexity of Data Reviewed Clinical lab tests: ordered and reviewed Tests in the radiology section of CPT: ordered and  reviewed Decide to obtain previous medical records or to obtain history from someone other than the patient: yes Obtain history from someone other than the patient: yes Review and summarize past medical records:  yes Discuss the patient with other providers: no Independent visualization of images, tracings, or specimens: yes  Risk of Complications, Morbidity, and/or Mortality Presenting problems: moderate Diagnostic procedures: moderate Management options: moderate  Patient Progress Patient progress: stable    Final Clinical Impression(s) / ED Diagnoses Final diagnoses:  Cellulitis of left lower extremity  Abscess of skin of left knee    Rx / DC Orders ED Discharge Orders    None       Blanchie Dessert, MD 03/09/20 2147

## 2020-03-09 NOTE — ED Triage Notes (Addendum)
Patient c/o left leg/knee pain, redness and swelling x2 days. He states the pain radiates upward to the thigh and hip. Patient reports a similar episode occurred several weeks ago but he left AMA at [facility] ED before being evaluated. Patient states he has chills x2 days.   Patient also states last week, he was working and a nail penetrated his knee. He states his tetanus shot is UTD. He reports the site on his knee is painful, hard and has yellowish discharge.  Denies taking any medication for fever or pain. He reports he took his rescue inhaler 30 pta for asthma. Hx IV meth use, admits last use was earlier today.

## 2020-03-12 ENCOUNTER — Ambulatory Visit: Payer: Medicaid Other | Admitting: Internal Medicine

## 2021-05-16 IMAGING — DX DG TIBIA/FIBULA 2V*L*
4 series · 4 of 4 positions shown · non-contrast
Comparison: None.

CLINICAL DATA: Walked into trailer hitch yesterday. Open wound to
anterior midshaft tibia.

EXAM:
LEFT TIBIA AND FIBULA - 2 VIEW

[tibia ap (1 of 2)]
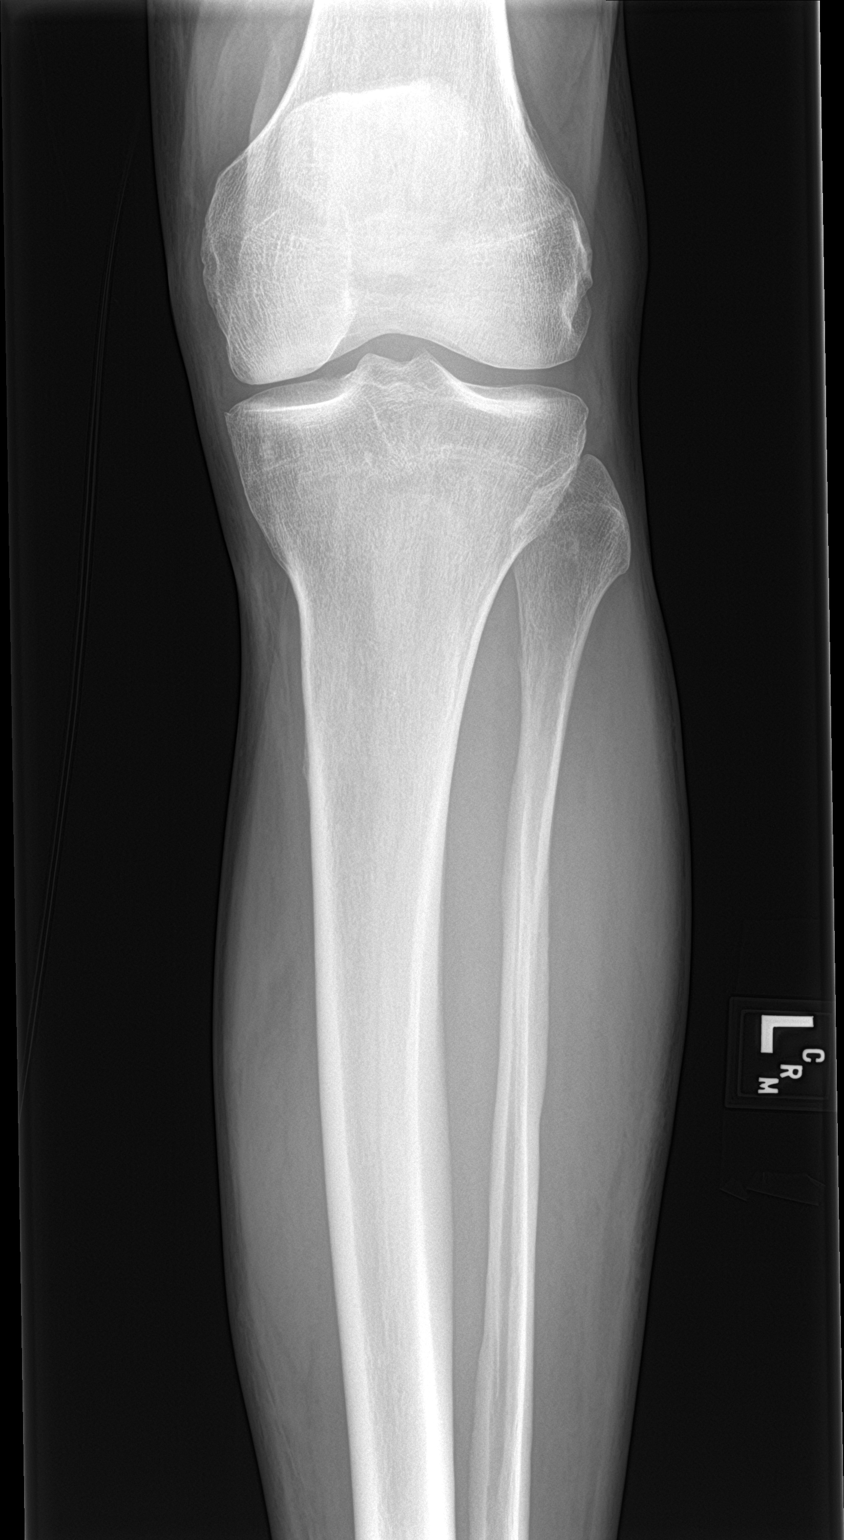

[tibia ap (2 of 2)]
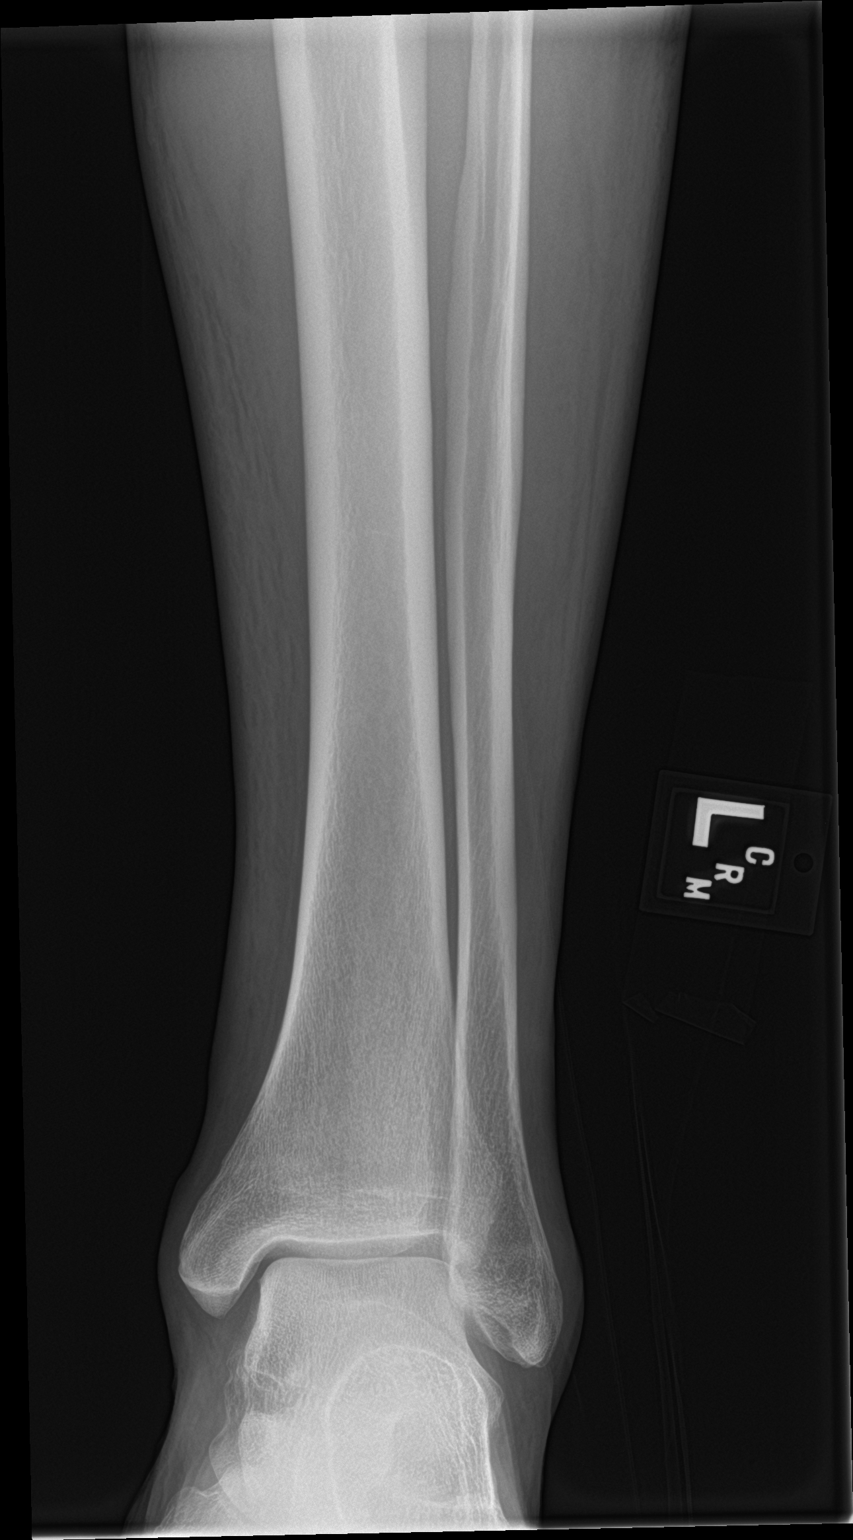

[tibia lat (1 of 2)]
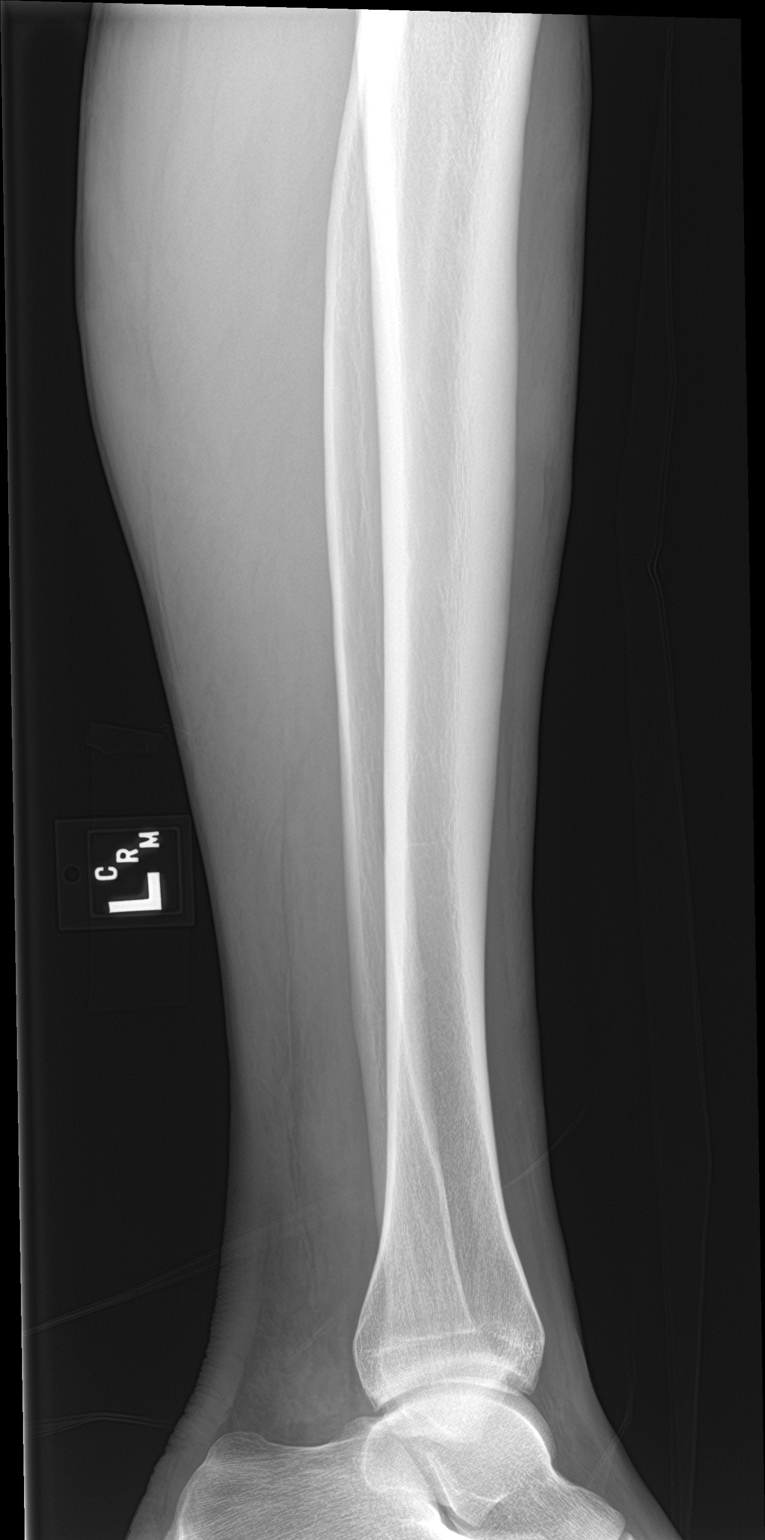

[tibia lat (2 of 2)]
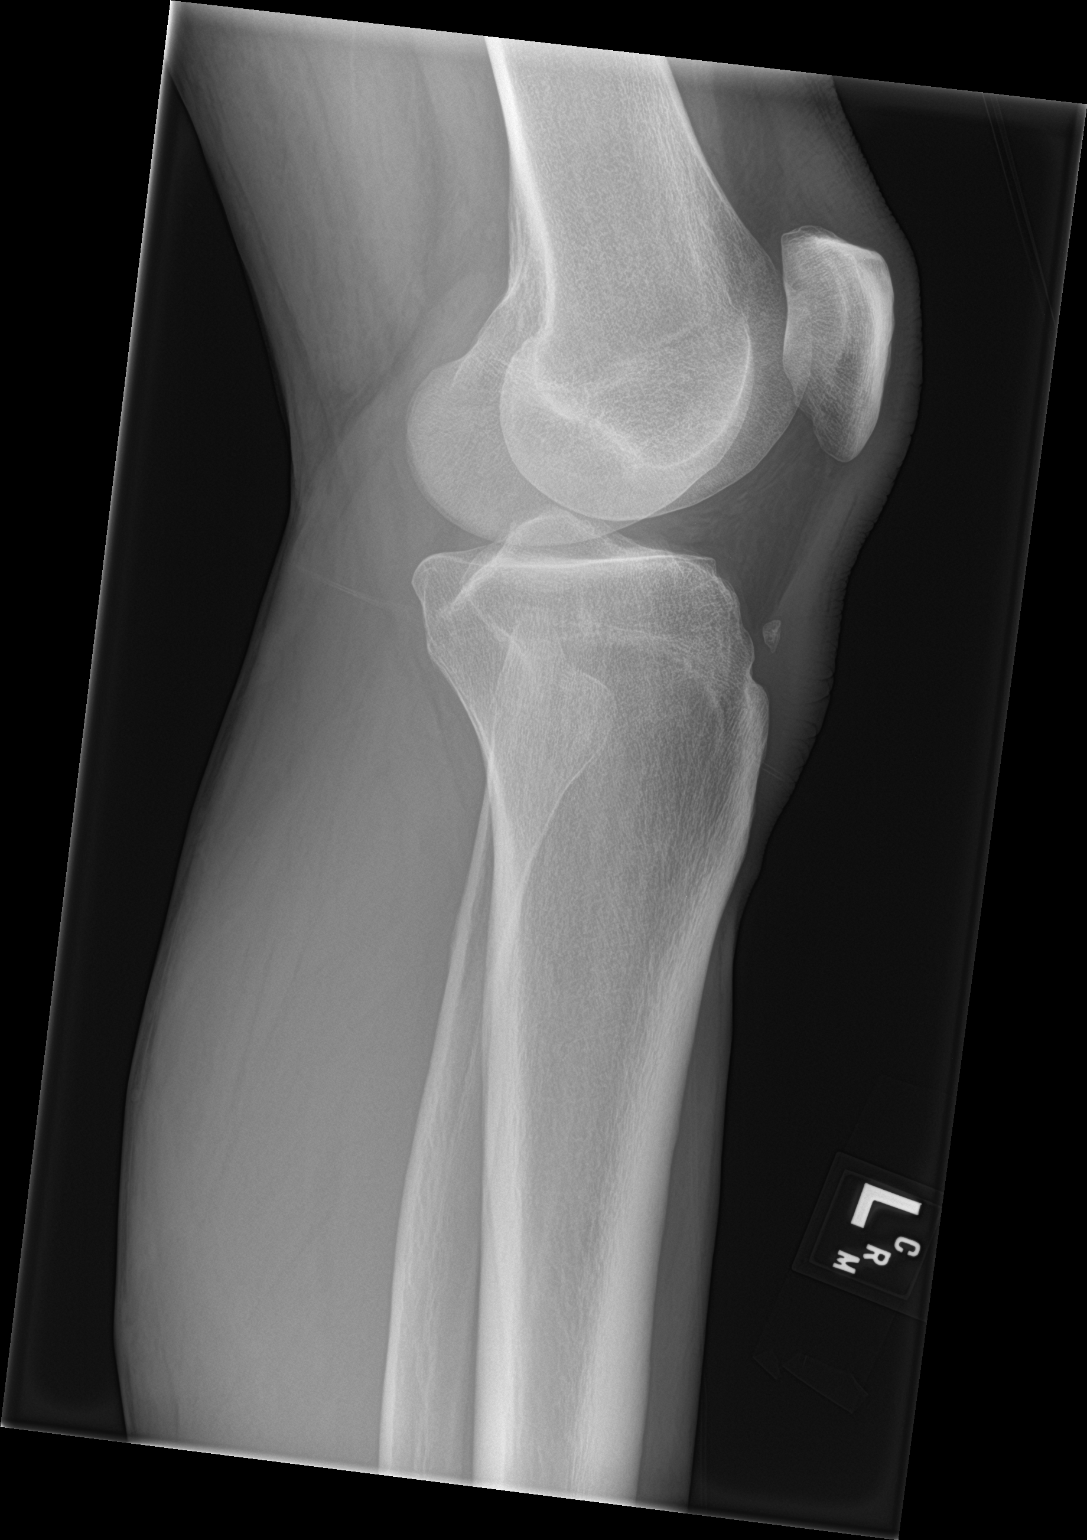

[4 of 4 positions shown; findings below may reference images not displayed]

FINDINGS: There is no evidence of fracture or other focal bone lesions.
Visualized portions of the left ankle and left knee are grossly
unremarkable. Known laceration not well visualized. Soft tissues are
unremarkable. No retained radiopaque foreign body.
IMPRESSION: Negative.

## 2021-12-04 DIAGNOSIS — Z419 Encounter for procedure for purposes other than remedying health state, unspecified: Secondary | ICD-10-CM | POA: Diagnosis not present

## 2022-01-04 DIAGNOSIS — Z419 Encounter for procedure for purposes other than remedying health state, unspecified: Secondary | ICD-10-CM | POA: Diagnosis not present

## 2022-02-03 ENCOUNTER — Emergency Department (HOSPITAL_COMMUNITY): Payer: Medicaid Other

## 2022-02-03 ENCOUNTER — Other Ambulatory Visit: Payer: Self-pay

## 2022-02-03 ENCOUNTER — Emergency Department (HOSPITAL_COMMUNITY)
Admission: EM | Admit: 2022-02-03 | Discharge: 2022-02-03 | Disposition: A | Discharge status: Home / Self Care | Payer: Medicaid Other | Attending: Emergency Medicine | Admitting: Emergency Medicine

## 2022-02-03 ENCOUNTER — Other Ambulatory Visit (HOSPITAL_COMMUNITY): Payer: Self-pay

## 2022-02-03 ENCOUNTER — Encounter (HOSPITAL_COMMUNITY): Payer: Self-pay

## 2022-02-03 DIAGNOSIS — Z85118 Personal history of other malignant neoplasm of bronchus and lung: Secondary | ICD-10-CM | POA: Insufficient documentation

## 2022-02-03 DIAGNOSIS — S2231XA Fracture of one rib, right side, initial encounter for closed fracture: Secondary | ICD-10-CM | POA: Diagnosis not present

## 2022-02-03 DIAGNOSIS — Z20822 Contact with and (suspected) exposure to covid-19: Secondary | ICD-10-CM | POA: Insufficient documentation

## 2022-02-03 DIAGNOSIS — F17200 Nicotine dependence, unspecified, uncomplicated: Secondary | ICD-10-CM | POA: Diagnosis not present

## 2022-02-03 DIAGNOSIS — R093 Abnormal sputum: Secondary | ICD-10-CM | POA: Insufficient documentation

## 2022-02-03 DIAGNOSIS — R000 Tachycardia, unspecified: Secondary | ICD-10-CM | POA: Diagnosis not present

## 2022-02-03 DIAGNOSIS — R062 Wheezing: Secondary | ICD-10-CM | POA: Diagnosis not present

## 2022-02-03 DIAGNOSIS — R6883 Chills (without fever): Secondary | ICD-10-CM | POA: Diagnosis not present

## 2022-02-03 DIAGNOSIS — R5383 Other fatigue: Secondary | ICD-10-CM | POA: Diagnosis not present

## 2022-02-03 DIAGNOSIS — R059 Cough, unspecified: Secondary | ICD-10-CM | POA: Diagnosis not present

## 2022-02-03 DIAGNOSIS — R051 Acute cough: Secondary | ICD-10-CM | POA: Insufficient documentation

## 2022-02-03 DIAGNOSIS — J439 Emphysema, unspecified: Secondary | ICD-10-CM | POA: Diagnosis not present

## 2022-02-03 LAB — BASIC METABOLIC PANEL
Anion gap: 9 (ref 5–15)
BUN: 15 mg/dL (ref 6–20)
CO2: 25 mmol/L (ref 22–32)
Calcium: 8.8 mg/dL — ABNORMAL LOW (ref 8.9–10.3)
Chloride: 102 mmol/L (ref 98–111)
Creatinine, Ser: 1.15 mg/dL (ref 0.61–1.24)
GFR, Estimated: >60 mL/min (ref >60)
Glucose, Bld: 140 mg/dL — ABNORMAL HIGH (ref 70–99)
Potassium: 3.9 mmol/L (ref 3.5–5.1)
Sodium: 136 mmol/L (ref 135–145)

## 2022-02-03 LAB — CBC
HCT: 40.1 % (ref 39.0–52.0)
Hemoglobin: 12.6 g/dL — ABNORMAL LOW (ref 13.0–17.0)
MCH: 28.8 pg (ref 26.0–34.0)
MCHC: 31.4 g/dL (ref 30.0–36.0)
MCV: 91.8 fL (ref 80.0–100.0)
Platelets: 249 10*3/uL (ref 150–400)
RBC: 4.37 MIL/uL (ref 4.22–5.81)
RDW: 13.5 % (ref 11.5–15.5)
WBC: 6 10*3/uL (ref 4.0–10.5)
nRBC: 0 % (ref 0.0–0.2)

## 2022-02-03 LAB — RESP PANEL BY RT-PCR (RSV, FLU A&B, COVID)  RVPGX2
Influenza A by PCR: NEGATIVE
Influenza B by PCR: NEGATIVE
Resp Syncytial Virus by PCR: NEGATIVE
SARS Coronavirus 2 by RT PCR: NEGATIVE

## 2022-02-03 MED ORDER — AMOXICILLIN-POT CLAVULANATE 875-125 MG PO TABS: 1.0000 | ORAL_TABLET | Freq: Two times a day (BID) | ORAL | 0 refills | Status: DC

## 2022-02-03 MED ORDER — AZITHROMYCIN 250 MG PO TABS: 250.0000 mg | ORAL_TABLET | Freq: Every day | ORAL | 0 refills | Status: DC

## 2022-02-03 MED ORDER — AZITHROMYCIN 250 MG PO TABS
250.0000 mg | ORAL_TABLET | Freq: Every day | ORAL | 0 refills | Status: DC
Start: 2022-02-03 — End: 2022-11-18
  Filled 2022-02-03: qty 6, 5d supply, fill #0

## 2022-02-03 MED ORDER — AMOXICILLIN-POT CLAVULANATE 875-125 MG PO TABS
1.0000 | ORAL_TABLET | Freq: Two times a day (BID) | ORAL | 0 refills | Status: DC
  Filled 2022-02-03: qty 14, 7d supply, fill #0

## 2022-02-03 NOTE — Discharge Instructions (Addendum)
Although you did not have any evidence of pneumonia today, with your history of lung cancer and emphysema I am going to place you on antibiotic therapy.  Please take as prescribed.  You may return to the emergency room for any worsening symptoms.

## 2022-02-03 NOTE — ED Triage Notes (Signed)
Pt arrived POV from home c/o fatigue, a cough, weakness and congestion for a couple days. Pt states he may have had sick contacts but unsure.

## 2022-02-03 NOTE — ED Provider Notes (Signed)
[facility] Provider Note   CSN: [id number] Arrival date & time: 02/03/22  6440     History Chief Complaint  Patient presents with   Fatigue   Cough    Andres Harding is a 57 y.o. male patient who presents to the emergency department today for further evaluation of productive cough With green sputum, general malaise, fatigue for the last 3 to 4 days.  Patient is homeless and has been getting some help locally in [place] where he had multiple sick contacts in a shelter.  Patient currently does have a diagnosis of lung cancer, is actively smoking, and is not under any chemotherapy regiment currently.  He denies chest pain, nausea, vomiting, diarrhea, fever, abdominal pain.  Cough      Home Medications Prior to Admission medications   Medication Sig Start Date End Date Taking? Authorizing Provider  amoxicillin-clavulanate (AUGMENTIN) 875-125 MG tablet Take 1 tablet by mouth every 12 (twelve) hours. 02/03/22  Yes Raul Del, Chrissy Ealey M, PA-C  azithromycin (ZITHROMAX) 250 MG tablet Take 1 tablet (250 mg total) by mouth daily. Take first 2 tablets together, then 1 every day until finished. 02/03/22  Yes Raul Del, Zeynab Klett M, PA-C  albuterol (PROVENTIL HFA;VENTOLIN HFA) 108 (90 Base) MCG/ACT inhaler Inhale 2 puffs into the lungs every 6 (six) hours as needed for wheezing or shortness of breath. 02/26/16   Gregor Hams, MD  albuterol (PROVENTIL HFA;VENTOLIN HFA) 108 (90 Base) MCG/ACT inhaler Inhale 2-4 puffs into the lungs every 6 (six) hours as needed for wheezing or shortness of breath. Patient not taking: Reported on 10/03/2017 05/13/17   Caccavale, Sophia, PA-C  HYDROcodone-acetaminophen (NORCO/VICODIN) 5-325 MG per tablet Take 2 tablets by mouth every 4 (four) hours as needed. Patient not taking: Reported on 10/03/2017 10/17/13   Leonard Schwartz, MD  ibuprofen (ADVIL,MOTRIN) 800 MG tablet Take 1 tablet (800 mg total) by mouth 3 (three) times  daily. Patient not taking: Reported on 10/03/2017 07/09/16   Fransico Meadow, PA-C  traMADol (ULTRAM) 50 MG tablet Take 1 tablet (50 mg total) by mouth every 6 (six) hours as needed. Patient not taking: Reported on 10/03/2017 09/28/16   Margarita Mail, PA-C      Allergies    Patient has no known allergies.    Review of Systems   Review of Systems  Respiratory:  Positive for cough.   All other systems reviewed and are negative.   Physical Exam Updated Vital Signs BP 131/76   Pulse 97   Temp 99.2 F (37.3 C) (Oral)   Resp 18   Ht '6\' 7"'$  (2.007 m)   Wt 80.7 kg   SpO2 98%   BMI 20.05 kg/m  Physical Exam Vitals and nursing note reviewed.  Constitutional:      General: He is not in acute distress.    Appearance: Normal appearance.  HENT:     Head: Normocephalic and atraumatic.  Eyes:     General:        Right eye: No discharge.        Left eye: No discharge.  Cardiovascular:     Rate and Rhythm: Regular rhythm. Tachycardia present.     Pulses: No decreased pulses.     Heart sounds: Normal heart sounds. Heart sounds not distant. No murmur heard. Pulmonary:     Effort: Pulmonary effort is normal.     Breath sounds: No decreased breath sounds, rhonchi or rales.     Comments: Faint expiratory wheeze. Abdominal:  General: Abdomen is flat. Bowel sounds are normal. There is no distension.     Tenderness: There is no abdominal tenderness. There is no guarding or rebound.  Musculoskeletal:        General: Normal range of motion.     Cervical back: Neck supple.  Skin:    General: Skin is warm and dry.     Findings: No rash.  Neurological:     General: No focal deficit present.     Mental Status: He is alert.  Psychiatric:        Mood and Affect: Mood normal.        Behavior: Behavior normal.     ED Results / Procedures / Treatments   Labs (all labs ordered are listed, but only abnormal results are displayed) Labs Reviewed  CBC - Abnormal; Notable for the following  components:      Result Value   Hemoglobin 12.6 (*)    All other components within normal limits  BASIC METABOLIC PANEL - Abnormal; Notable for the following components:   Glucose, Bld 140 (*)    Calcium 8.8 (*)    All other components within normal limits  RESP PANEL BY RT-PCR (RSV, FLU A&B, COVID)  [id number]    EKG None  Radiology DG Chest 2 View  Result Date: 02/03/2022 CLINICAL DATA:  Cough, shortness of breath and chills for 3 days. EXAM: CHEST - 2 VIEW COMPARISON:  Chest x-ray 07/09/2016 FINDINGS: The cardiac silhouette, mediastinal and hilar contours within limits. Stable underlying emphysematous changes and hyperinflation. Biapical scarring changes are again noted. No infiltrates or effusions. No pulmonary edema. Bilateral nipple shadows are noted. There is a nodular density in the left lower lobe not for certain seen on the prior study. Recommend chest CT for further evaluation to exclude a pulmonary nodule. The bony thorax is intact. Remote healed right rib fractures are noted. IMPRESSION: 1. Underlying emphysematous changes and pulmonary scarring. 2. No superimposed pulmonary infiltrates or pleural effusions. 3. Possible left lower lobe pulmonary nodule. Recommend follow-up chest CT. Electronically Signed   By: Marijo Sanes M.D.   On: 02/03/2022 10:05    Procedures Procedures    Medications Ordered in ED Medications - No data to display  ED Course/ Medical Decision Making/ A&P Clinical Course as of 02/03/22 1226  Wed Feb 03, 2022  1221 CBC(!) Normal. [CF]  4782 Basic metabolic panel(!) Normal  [CF]  1222 Resp panel by RT-PCR (RSV, Flu A&B, Covid) Anterior Nasal Swab Negative.  [CF]  9562 DG Chest 2 View I personally ordered and interpreted this chest x-ray and do not see any evidence of pneumonia.  I do agree with the radiologist interpretation. [CF]    Clinical Course User Index [CF] Hendricks Limes, PA-C   {   Click here for ABCD2, HEART and other  calculators  Medical Decision Making Andres Harding is a 57 y.o. male patient who presents to the emergency department today for further evaluation of cough, general malaise.  Given the patient's history we will evaluate with some labs and chest x-ray.  Patient is tachycardic on initial vitals without evidence of tachypnea.  He is oxygenating well on room air.  Afebrile temperature. He is in no acute distress at this time.   No evidence of pneumonia on chest x-ray.  No signs of leukocytosis or viral etiology.  Patient does have immunocompromise status at baseline secondary to lung cancer and with his history of emphysema I will start him on empiric  antibiotic therapy.  Strict return precautions were discussed.  He is safe for discharge at this time.  Amount and/or Complexity of Data Reviewed Labs:  Decision-making details documented in ED Course. Radiology:  Decision-making details documented in ED Course.  Risk Prescription drug management.    Final Clinical Impression(s) / ED Diagnoses Final diagnoses:  Acute cough    Rx / DC Orders ED Discharge Orders          Ordered    amoxicillin-clavulanate (AUGMENTIN) 875-125 MG tablet  Every 12 hours        02/03/22 1225    azithromycin (ZITHROMAX) 250 MG tablet  Daily        02/03/22 1225              Myna Bright [place], [place] 02/03/22 1226    Blanchie Dessert, MD 02/03/22 1448

## 2022-02-03 NOTE — ED Provider Triage Note (Signed)
Emergency Medicine Provider Triage Evaluation Note  Andres Harding , a 57 y.o. male  was evaluated in triage.  Pt complains of shortness of breath, cough, fatigue, chills for 3 to 4 days, with history of COPD.  Patient concerned he may be developing pneumonia.  He reports some thick green sputum.  Additionally patient reports that 2 to 3 months ago he was having some penile discharge, with some concern for STI, and would like to be tested today.  Patient reports some mild nausea, denies vomiting, chest pain, he does feel shortness of breath..  Review of Systems  Positive: Shob, cough, chills Negative: Fever, vomiting, diarrhea  Physical Exam  BP (!) 169/73 (BP Location: Right Arm)   Pulse (!) 116   Temp 98.4 F (36.9 C) (Oral)   Resp 20   Ht '6\' 7"'$  (2.007 m)   Wt 80.7 kg   SpO2 100%   BMI 20.05 kg/m  Gen:   Awake, no distress   Resp:  Normal effort  MSK:   Moves extremities without difficulty  Other:  Tachycardia, tachypnea, no accessory breath sounds on my exam  Medical Decision Making  Medically screening exam initiated at 9:36 AM.  Appropriate orders placed.  Hervey Ard was informed that the remainder of the evaluation will be completed by another provider, this initial triage assessment does not replace that evaluation, and the importance of remaining in the ED until their evaluation is complete.  Workup initiated in triage    Anselmo Pickler, [place] 02/03/22 0712

## 2022-02-03 NOTE — Discharge Planning (Signed)
RNCM consulted regarding insured pt requiring antibiotic Rx.  RNCM suggests sending to [facility] as will deliver Rx to patient at bedside prior to discharge from hospital.

## 2022-02-04 DIAGNOSIS — Z419 Encounter for procedure for purposes other than remedying health state, unspecified: Secondary | ICD-10-CM | POA: Diagnosis not present

## 2022-02-15 ENCOUNTER — Telehealth: Payer: Self-pay

## 2022-02-15 NOTE — Telephone Encounter (Signed)
Mychart msg sent. AS, CMA

## 2022-03-05 DIAGNOSIS — Z419 Encounter for procedure for purposes other than remedying health state, unspecified: Secondary | ICD-10-CM | POA: Diagnosis not present

## 2022-04-05 DIAGNOSIS — Z419 Encounter for procedure for purposes other than remedying health state, unspecified: Secondary | ICD-10-CM | POA: Diagnosis not present

## 2022-05-05 DIAGNOSIS — Z419 Encounter for procedure for purposes other than remedying health state, unspecified: Secondary | ICD-10-CM | POA: Diagnosis not present

## 2022-11-18 ENCOUNTER — Ambulatory Visit (HOSPITAL_COMMUNITY)
Admission: EM | Admit: 2022-11-18 | Discharge: 2022-11-18 | Disposition: A | Discharge status: Home / Self Care | Payer: 59 | Attending: Internal Medicine | Admitting: Internal Medicine

## 2022-11-18 ENCOUNTER — Encounter (HOSPITAL_COMMUNITY): Payer: Self-pay

## 2022-11-18 ENCOUNTER — Ambulatory Visit (INDEPENDENT_AMBULATORY_CARE_PROVIDER_SITE_OTHER): Payer: 59

## 2022-11-18 DIAGNOSIS — J441 Chronic obstructive pulmonary disease with (acute) exacerbation: Secondary | ICD-10-CM

## 2022-11-18 DIAGNOSIS — R0602 Shortness of breath: Secondary | ICD-10-CM | POA: Diagnosis not present

## 2022-11-18 DIAGNOSIS — R059 Cough, unspecified: Secondary | ICD-10-CM | POA: Diagnosis not present

## 2022-11-18 MED ORDER — FLUTICASONE-SALMETEROL 230-21 MCG/ACT IN AERO: 2.0000 | INHALATION_SPRAY | Freq: Two times a day (BID) | RESPIRATORY_TRACT | 0 refills | Status: DC

## 2022-11-18 MED ORDER — ALBUTEROL SULFATE HFA 108 (90 BASE) MCG/ACT IN AERS
2.0000 | INHALATION_SPRAY | Freq: Four times a day (QID) | RESPIRATORY_TRACT | 2 refills | Status: DC | PRN
Start: 2022-11-18 — End: 2022-11-18

## 2022-11-18 MED ORDER — ALBUTEROL SULFATE HFA 108 (90 BASE) MCG/ACT IN AERS: 2.0000 | INHALATION_SPRAY | Freq: Four times a day (QID) | RESPIRATORY_TRACT | 2 refills | Status: DC | PRN

## 2022-11-18 MED ORDER — PREDNISONE 20 MG PO TABS: 40.0000 mg | ORAL_TABLET | Freq: Every day | ORAL | 0 refills | Status: AC

## 2022-11-18 MED ORDER — DOXYCYCLINE HYCLATE 100 MG PO CAPS: 100.0000 mg | ORAL_CAPSULE | Freq: Two times a day (BID) | ORAL | 0 refills | Status: AC

## 2022-11-18 NOTE — Discharge Instructions (Addendum)
Please maintain adequate hydration Please take medications as directed Your chest x-ray is negative for pneumonia Smoke cessation will help with lung symptoms Please feel free to return to urgent care if you have worsening symptoms.

## 2022-11-18 NOTE — ED Provider Notes (Signed)
[facility]    CSN: [id number] Arrival date & time: 11/18/22  7169      History   Chief Complaint Chief Complaint  Patient presents with   Shortness of Breath    HPI Andres Harding is a 57 y.o. male with a history of emphysema comes to the urgent care with few weeks history of cough and occasional wheezing.  Patient's symptoms have been persistent.  He endorses chills but denies any fever.  No home oxygen use.  Patient continues to smoke a pack of cigarettes a day.  He has about 40-pack-year history of smoking.  No sputum production.  Patient's physical activity has been limited because of worsening shortness of breath with activity.  No weight loss.  No night sweats.  No nausea, vomiting or diarrhea.  Patient has ran out of his inhalers. HPI  Past Medical History:  Diagnosis Date   Asthma    Back pain    Bronchitis    Cancer (HCC)    COPD (chronic obstructive pulmonary disease) (HCC)    Emphysema lung (HCC)    Emphysema of lung (HCC)    Emphysema of lung (HCC)     There are no problems to display for this patient.   History reviewed. No pertinent surgical history.     Home Medications    Prior to Admission medications   Medication Sig Start Date End Date Taking? Authorizing Provider  doxycycline (VIBRAMYCIN) 100 MG capsule Take 1 capsule (100 mg total) by mouth 2 (two) times daily for 5 days. 11/18/22 11/23/22 Yes Talal Fritchman, Britta Mccreedy, MD  fluticasone-salmeterol (ADVAIR HFA) [phone] MCG/ACT inhaler Inhale 2 puffs into the lungs 2 (two) times daily. 11/18/22  Yes Nakeysha Pasqual, Britta Mccreedy, MD  predniSONE (DELTASONE) 20 MG tablet Take 2 tablets (40 mg total) by mouth daily for 5 days. 11/18/22 11/23/22 Yes Noemy Hallmon, Britta Mccreedy, MD  albuterol (VENTOLIN HFA) 108 (90 Base) MCG/ACT inhaler Inhale 2 puffs into the lungs every 6 (six) hours as needed for wheezing or shortness of breath. 11/18/22   LampteyBritta Mccreedy, MD    Family History History reviewed. No pertinent  family history.  Social History Social History   Tobacco Use   Smoking status: Every Day    Current packs/day: 0.80    Types: Cigarettes   Smokeless tobacco: Never  Vaping Use   Vaping status: Never Used  Substance Use Topics   Alcohol use: Yes    Comment: occasional   Drug use: Yes    Types: Methamphetamines, IV    Comment: heroin; crystal meth     Allergies   Patient has no known allergies.   Review of Systems Review of Systems As per HPI Physical Exam Triage Vital Signs ED Triage Vitals  Encounter Vitals Group     BP 11/18/22 1009 (!) 125/91     Systolic BP Percentile --      Diastolic BP Percentile --      Pulse Rate 11/18/22 1009 (!) 109     Resp 11/18/22 1009 18     Temp 11/18/22 1009 (!) 97.4 F (36.3 C)     Temp Source 11/18/22 1009 Oral     SpO2 11/18/22 1009 98 %     Weight 11/18/22 1011 175 lb (79.4 kg)     Height 11/18/22 1011 6\' 7"  (2.007 m)     Head Circumference --      Peak Flow --      Pain Score 11/18/22 1010 0  Pain Loc --      Pain Education --      Exclude from Growth Chart --    No data found.  Updated Vital Signs BP (!) 125/91 (BP Location: Left Arm)   Pulse (!) 109 Comment: last dose of albuterol in waiting room.  Temp (!) 97.4 F (36.3 C) (Oral)   Resp 18   Ht 6\' 7"  (2.007 m)   Wt 79.4 kg   SpO2 98%   BMI 19.71 kg/m   Visual Acuity Right Eye Distance:   Left Eye Distance:   Bilateral Distance:    Right Eye Near:   Left Eye Near:    Bilateral Near:     Physical Exam Vitals and nursing note reviewed.  Constitutional:      General: He is not in acute distress.    Appearance: He is ill-appearing.  Cardiovascular:     Rate and Rhythm: Normal rate and regular rhythm.  Pulmonary:     Effort: Pulmonary effort is normal.     Breath sounds: Examination of the right-lower field reveals decreased breath sounds and wheezing. Examination of the left-lower field reveals wheezing. Decreased breath sounds and wheezing  present. No rhonchi or rales.  Abdominal:     General: Bowel sounds are normal.     Palpations: Abdomen is soft.  Neurological:     Mental Status: He is alert.      UC Treatments / Results  Labs (all labs ordered are listed, but only abnormal results are displayed) Labs Reviewed - No data to display  EKG   Radiology No results found.  Procedures Procedures (including critical care time)  Medications Ordered in UC Medications - No data to display  Initial Impression / Assessment and Plan / UC Course  I have reviewed the triage vital signs and the nursing notes.  Pertinent labs & imaging results that were available during my care of the patient were reviewed by me and considered in my medical decision making (see chart for details).     1.  COPD with acute exacerbation: Albuterol inhaler as needed for wheezing Advair twice daily Prednisone 40 mg orally daily x 5 days Chest x-ray is negative for lung infiltrate Doxycycline 100 mg twice daily for 5 days Return precautions given Tobacco cessation advised-patient is in the precontemplative stage of tobacco cessation. Final Clinical Impressions(s) / UC Diagnoses   Final diagnoses:  None   Discharge Instructions   None    ED Prescriptions     Medication Sig Dispense Auth. Provider   albuterol (VENTOLIN HFA) 108 (90 Base) MCG/ACT inhaler Inhale 2 puffs into the lungs every 6 (six) hours as needed for wheezing or shortness of breath. 1 each Merrilee Jansky, MD   fluticasone-salmeterol (ADVAIR HFA) [phone] MCG/ACT inhaler Inhale 2 puffs into the lungs 2 (two) times daily. 1 each Demarri Elie, Britta Mccreedy, MD   predniSONE (DELTASONE) 20 MG tablet Take 2 tablets (40 mg total) by mouth daily for 5 days. 10 tablet Salle Brandle, Britta Mccreedy, MD   doxycycline (VIBRAMYCIN) 100 MG capsule Take 1 capsule (100 mg total) by mouth 2 (two) times daily for 5 days. 10 capsule Vernadette Stutsman, Britta Mccreedy, MD      PDMP not reviewed this encounter.    Merrilee Jansky, MD 11/18/22 1128

## 2022-11-18 NOTE — ED Triage Notes (Signed)
Pt presents with SOB x "quite awhile now." Per pt description, hx of asthma and bronchitis. Pt reports he is an everyday cigarette smoker. Pt states he is currently out of his albuterol inhaler, "prescription has been out for about one month now." Pt's O2 stat is 98% in triage on room air, respirations regular//unlabored.

## 2022-11-28 ENCOUNTER — Other Ambulatory Visit: Payer: Self-pay

## 2022-11-28 ENCOUNTER — Emergency Department (HOSPITAL_COMMUNITY)
Admission: EM | Admit: 2022-11-28 | Discharge: 2022-11-29 | Disposition: A | Discharge status: Home / Self Care | Payer: 59 | Attending: Emergency Medicine | Admitting: Emergency Medicine

## 2022-11-28 ENCOUNTER — Encounter (HOSPITAL_COMMUNITY): Payer: Self-pay | Admitting: *Deleted

## 2022-11-28 DIAGNOSIS — T23269A Burn of second degree of back of unspecified hand, initial encounter: Secondary | ICD-10-CM | POA: Diagnosis not present

## 2022-11-28 DIAGNOSIS — Y92410 Unspecified street and highway as the place of occurrence of the external cause: Secondary | ICD-10-CM | POA: Diagnosis not present

## 2022-11-28 DIAGNOSIS — T23201A Burn of second degree of right hand, unspecified site, initial encounter: Secondary | ICD-10-CM | POA: Diagnosis not present

## 2022-11-28 DIAGNOSIS — T310 Burns involving less than 10% of body surface: Secondary | ICD-10-CM | POA: Diagnosis not present

## 2022-11-28 DIAGNOSIS — M25511 Pain in right shoulder: Secondary | ICD-10-CM
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
## 2022-11-29 ENCOUNTER — Emergency Department (HOSPITAL_COMMUNITY): Payer: 59

## 2022-11-29 MED ORDER — HYDROCODONE-ACETAMINOPHEN 5-325 MG PO TABS: 1.0000 | ORAL_TABLET | Freq: Once | ORAL | Status: DC

## 2022-11-29 NOTE — ED Notes (Addendum)
ED tech informed this RN that pt refused xray and treatment and eloped from ED. Pt seen ambulating with steady gait and appears to be in no distress. Provider made aware.
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
## 2022-11-30 ENCOUNTER — Encounter (HOSPITAL_COMMUNITY): Payer: Self-pay

## 2022-11-30 ENCOUNTER — Ambulatory Visit (INDEPENDENT_AMBULATORY_CARE_PROVIDER_SITE_OTHER): Payer: 59

## 2022-11-30 ENCOUNTER — Ambulatory Visit (HOSPITAL_COMMUNITY)
Admission: EM | Admit: 2022-11-30 | Discharge: 2022-11-30 | Disposition: A | Discharge status: Home / Self Care | Payer: 59 | Attending: Internal Medicine | Admitting: Internal Medicine

## 2022-11-30 DIAGNOSIS — T23171A Burn of first degree of right wrist, initial encounter: Secondary | ICD-10-CM

## 2022-11-30 DIAGNOSIS — Z23 Encounter for immunization: Secondary | ICD-10-CM

## 2022-11-30 DIAGNOSIS — S42021A Displaced fracture of shaft of right clavicle, initial encounter for closed fracture: Secondary | ICD-10-CM | POA: Diagnosis not present

## 2022-11-30 DIAGNOSIS — S42001A Fracture of unspecified part of right clavicle, initial encounter for closed fracture: Secondary | ICD-10-CM

## 2022-11-30 DIAGNOSIS — M25511 Pain in right shoulder: Secondary | ICD-10-CM

## 2022-11-30 MED ORDER — TETANUS-DIPHTH-ACELL PERTUSSIS 5-2.5-18.5 LF-MCG/0.5 IM SUSY
0.5000 mL | PREFILLED_SYRINGE | Freq: Once | INTRAMUSCULAR | Status: AC
  Administered 2022-11-30: 0.5 mL via INTRAMUSCULAR

## 2022-11-30 MED ORDER — TETANUS-DIPHTH-ACELL PERTUSSIS 5-2.5-18.5 LF-MCG/0.5 IM SUSY
PREFILLED_SYRINGE | INTRAMUSCULAR | Status: AC
  Filled 2022-11-30: qty 0.5

## 2022-11-30 MED ORDER — HYDROCODONE-ACETAMINOPHEN 5-325 MG PO TABS: 1.0000 | ORAL_TABLET | Freq: Four times a day (QID) | ORAL | 0 refills | Status: DC | PRN
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
## 2022-11-30 NOTE — Discharge Instructions (Signed)
You have a broken your collarbone.  Sling applied.  Do not push, pull, lift until otherwise advised by orthopedist.  Call orthopedist first thing in the morning and schedule an appointment for further evaluation.  I have prescribed you Norco for pain.  It does contain Tylenol so do not take any additional Tylenol.  It can make you drowsy so do not drive or drink alcohol or do any illicit drugs with it.
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
## 2022-12-16 ENCOUNTER — Emergency Department (HOSPITAL_COMMUNITY)
Admission: EM | Admit: 2022-12-16 | Discharge: 2022-12-16 | Disposition: A | Discharge status: Home / Self Care | Payer: 59 | Attending: Emergency Medicine | Admitting: Emergency Medicine

## 2022-12-16 ENCOUNTER — Encounter (HOSPITAL_COMMUNITY): Payer: Self-pay

## 2022-12-16 ENCOUNTER — Other Ambulatory Visit: Payer: Self-pay

## 2022-12-16 DIAGNOSIS — J45909 Unspecified asthma, uncomplicated: Secondary | ICD-10-CM | POA: Insufficient documentation

## 2022-12-16 DIAGNOSIS — G4489 Other headache syndrome: Secondary | ICD-10-CM | POA: Diagnosis not present

## 2022-12-16 DIAGNOSIS — S42021D Displaced fracture of shaft of right clavicle, subsequent encounter for fracture with routine healing: Secondary | ICD-10-CM | POA: Diagnosis not present

## 2022-12-16 DIAGNOSIS — S42001A Fracture of unspecified part of right clavicle, initial encounter for closed fracture: Secondary | ICD-10-CM | POA: Insufficient documentation

## 2022-12-16 DIAGNOSIS — S42001S Fracture of unspecified part of right clavicle, sequela: Secondary | ICD-10-CM

## 2022-12-16 DIAGNOSIS — S4991XA Unspecified injury of right shoulder and upper arm, initial encounter: Secondary | ICD-10-CM | POA: Diagnosis not present

## 2022-12-16 DIAGNOSIS — Y92410 Unspecified street and highway as the place of occurrence of the external cause: Secondary | ICD-10-CM | POA: Insufficient documentation

## 2022-12-16 DIAGNOSIS — I10 Essential (primary) hypertension: Secondary | ICD-10-CM | POA: Diagnosis not present

## 2022-12-16 DIAGNOSIS — J449 Chronic obstructive pulmonary disease, unspecified: Secondary | ICD-10-CM | POA: Insufficient documentation

## 2022-12-16 MED ORDER — ACETAMINOPHEN 500 MG PO TABS
1000.0000 mg | ORAL_TABLET | Freq: Once | ORAL | Status: AC
  Administered 2022-12-16: 1000 mg via ORAL
  Filled 2022-12-16: qty 2

## 2022-12-16 NOTE — Discharge Instructions (Addendum)
Please follow-up with orthopedics as discussed.  Please alternate Tylenol and ibuprofen for pain control.  I would encourage you to wear your sling as discussed with urgent care.  Please return to emergency room with new or worsening symptoms.
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
## 2023-01-31 ENCOUNTER — Encounter (HOSPITAL_COMMUNITY): Payer: Self-pay | Admitting: *Deleted

## 2023-01-31 ENCOUNTER — Other Ambulatory Visit: Payer: Self-pay

## 2023-01-31 ENCOUNTER — Emergency Department (HOSPITAL_COMMUNITY)
Admission: EM | Admit: 2023-01-31 | Discharge: 2023-01-31 | Discharge status: Against Medical Advice | Payer: Medicaid Other | Attending: Emergency Medicine | Admitting: Emergency Medicine

## 2023-01-31 DIAGNOSIS — Z5321 Procedure and treatment not carried out due to patient leaving prior to being seen by health care provider: Secondary | ICD-10-CM | POA: Insufficient documentation

## 2023-01-31 DIAGNOSIS — M79646 Pain in unspecified finger(s): Secondary | ICD-10-CM | POA: Diagnosis present

## 2023-01-31 NOTE — ED Notes (Signed)
Patient left and will go to urgent care later due to wait time

## 2023-01-31 NOTE — ED Triage Notes (Signed)
The pt is c/o painful  index and middle fingers  for one week

## 2023-02-03 ENCOUNTER — Ambulatory Visit (HOSPITAL_COMMUNITY)
Admission: EM | Admit: 2023-02-03 | Discharge: 2023-02-03 | Disposition: A | Discharge status: Home / Self Care | Payer: Medicaid Other | Attending: Emergency Medicine | Admitting: Emergency Medicine

## 2023-02-03 ENCOUNTER — Encounter (HOSPITAL_COMMUNITY): Payer: Self-pay

## 2023-02-03 DIAGNOSIS — B351 Tinea unguium: Secondary | ICD-10-CM

## 2023-02-03 MED ORDER — TERBINAFINE HCL 250 MG PO TABS: 250.0000 mg | ORAL_TABLET | Freq: Every day | ORAL | 0 refills | Status: AC

## 2023-02-03 NOTE — Discharge Instructions (Signed)
For treatment of fungal nail infection, please begin terbinafine 1 tablet daily for the next 6 weeks.  A few weeks after you complete this medication he will begin to see healthy new nail growth.  Thank you for visiting [facility] today.
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
## 2023-02-03 NOTE — ED Triage Notes (Signed)
Pain in right index and middle finger onset 1.5 months ago. Patient has ingrown nails on both fingers.   Patient has tried Advil with no relief.

## 2023-02-07 ENCOUNTER — Ambulatory Visit (HOSPITAL_COMMUNITY)
Admission: EM | Admit: 2023-02-07 | Discharge: 2023-02-07 | Disposition: A | Discharge status: Home / Self Care | Payer: Medicaid Other | Attending: Physician Assistant | Admitting: Physician Assistant

## 2023-02-07 ENCOUNTER — Encounter (HOSPITAL_COMMUNITY): Payer: Self-pay

## 2023-02-07 DIAGNOSIS — R6889 Other general symptoms and signs: Secondary | ICD-10-CM | POA: Diagnosis not present

## 2023-02-07 DIAGNOSIS — J111 Influenza due to unidentified influenza virus with other respiratory manifestations: Secondary | ICD-10-CM

## 2023-02-07 MED ORDER — OSELTAMIVIR PHOSPHATE 75 MG PO CAPS: 75.0000 mg | ORAL_CAPSULE | Freq: Two times a day (BID) | ORAL | 0 refills | Status: AC

## 2023-02-07 NOTE — Discharge Instructions (Addendum)
At this time I suspect that you likely have the flu.  I have sent in a medication called Tamiflu for you to take twice per day for the next 5 days.  This medication will help reduce the severity of your symptoms and hopefully reduce how long you are feeling bad.  You can also continue to take DayQuil/NyQuil as needed to help control symptoms.  If you notice that your breathing becomes worse, you develop fevers that are not responding to medications, confusion, feeling weak or fatigued please go to the emergency room for prompt evaluation and management Please try to use a GoodRx coupon to assist with medication coverage At the end of your paperwork there is a link for you to scan with your phone that can help you get established with a PCP.  This would be beneficial as they can help set you up with medication assistance and manage your chronic medical conditions more closely.

## 2023-02-07 NOTE — ED Triage Notes (Signed)
Pt c/o chills, body aches, and fever x2 days. Pt also states he has been unable to fill the prescriptions from his recent visit due to cost. Would like cheaper alternative.

## 2023-02-07 NOTE — ED Provider Notes (Addendum)
[facility]    CSN: [id number] Arrival date & time: 02/07/23  1903      History   Chief Complaint Chief Complaint  Patient presents with   Generalized Body Aches   Chills    HPI Andres Harding is a 58 y.o. male.   HPI  He reports he has been feeling sick since 02/05/23  He states he is feeling chills, subjective fever, body aches, fatigue, predominantly dry cough He reports he is having SOB   He reports he has a hx of COPD and emphysema and is current smoker  He reports he is unhoused and is exposed to elements most nights He does not think he has been around recent sick contacts.  He reports that he has not had to use his albuterol inhaler today to assist with breathing.  He states he has not been able to afford his antifungal medication for onychomycosis- reviewed using GoodRx card and getting established with PCP for assistance with medication costs   Past Medical History:  Diagnosis Date   Asthma    Back pain    Bronchitis    Cancer (HCC)    COPD (chronic obstructive pulmonary disease) (HCC)    Emphysema lung (HCC)    Emphysema of lung (HCC)    Emphysema of lung (HCC)     There are no active problems to display for this patient.   History reviewed. No pertinent surgical history.     Home Medications    Prior to Admission medications   Medication Sig Start Date End Date Taking? Authorizing Provider  oseltamivir (TAMIFLU) 75 MG capsule Take 1 capsule (75 mg total) by mouth 2 (two) times daily for 5 days. 02/07/23 02/12/23 Yes Jaycey Gens E, PA-C  albuterol (VENTOLIN HFA) 108 (90 Base) MCG/ACT inhaler Inhale 2 puffs into the lungs every 6 (six) hours as needed for wheezing or shortness of breath. 11/18/22   Merrilee Jansky, MD  terbinafine (LAMISIL) 250 MG tablet Take 1 tablet (250 mg total) by mouth daily. 02/03/23 03/17/23  Theadora Rama Scales, PA-C    Family History No family history on file.  Social History Social History   Tobacco  Use   Smoking status: Every Day    Current packs/day: 0.80    Types: Cigarettes   Smokeless tobacco: Never  Vaping Use   Vaping status: Never Used  Substance Use Topics   Alcohol use: Yes    Comment: occasional   Drug use: Yes    Types: Methamphetamines, IV    Comment: heroin; crystal meth     Allergies   Patient has no known allergies.   Review of Systems Review of Systems  Constitutional:  Positive for chills, diaphoresis, fatigue and fever.  HENT:  Positive for congestion, postnasal drip and rhinorrhea. Negative for ear pain and sore throat.   Respiratory:  Positive for cough, shortness of breath and wheezing.   Gastrointestinal:  Negative for diarrhea, nausea and vomiting.  Musculoskeletal:  Negative for myalgias.     Physical Exam Triage Vital Signs ED Triage Vitals  Encounter Vitals Group     BP 02/07/23 2033 117/83     Systolic BP Percentile --      Diastolic BP Percentile --      Pulse Rate 02/07/23 2033 (!) 102     Resp 02/07/23 2033 (!) 22     Temp 02/07/23 2033 99.1 F (37.3 C)     Temp Source 02/07/23 2033 Oral     SpO2  02/07/23 2033 94 %     Weight --      Height --      Head Circumference --      Peak Flow --      Pain Score 02/07/23 2032 7     Pain Loc --      Pain Education --      Exclude from Growth Chart --    No data found.  Updated Vital Signs BP 117/83 (BP Location: Left Arm)   Pulse (!) 102   Temp 99.1 F (37.3 C) (Oral)   Resp (!) 22   SpO2 94%   Visual Acuity Right Eye Distance:   Left Eye Distance:   Bilateral Distance:    Right Eye Near:   Left Eye Near:    Bilateral Near:     Physical Exam Vitals reviewed.  Constitutional:      General: He is awake.     Appearance: Normal appearance. He is well-developed and well-groomed.  HENT:     Head: Normocephalic and atraumatic.     Right Ear: Hearing, tympanic membrane and ear canal normal.     Left Ear: Hearing, tympanic membrane and ear canal normal.     Mouth/Throat:      Lips: Pink.     Mouth: Mucous membranes are moist.     Pharynx: Oropharynx is clear. Uvula midline. No pharyngeal swelling, oropharyngeal exudate, posterior oropharyngeal erythema, uvula swelling or postnasal drip.     Tonsils: No tonsillar exudate or tonsillar abscesses.  Eyes:     General: Lids are normal. Gaze aligned appropriately.     Extraocular Movements: Extraocular movements intact.  Cardiovascular:     Rate and Rhythm: Normal rate and regular rhythm.     Heart sounds: Normal heart sounds.  Pulmonary:     Effort: Pulmonary effort is normal.     Breath sounds: Normal breath sounds. Decreased air movement present. No decreased breath sounds, wheezing, rhonchi or rales.     Comments: Mildly decreased air movement, lungs overall clear without wheezing or notable reduced breath sounds  Chest:    Musculoskeletal:     Cervical back: Normal range of motion and neck supple.  Lymphadenopathy:     Head:     Right side of head: No submental, submandibular or preauricular adenopathy.     Left side of head: No submental, submandibular or preauricular adenopathy.     Cervical:     Right cervical: No superficial cervical adenopathy.    Left cervical: No superficial cervical adenopathy.     Upper Body:     Right upper body: No supraclavicular adenopathy.     Left upper body: No supraclavicular adenopathy.  Skin:    General: Skin is warm and dry.  Neurological:     General: No focal deficit present.     Mental Status: He is alert and oriented to person, place, and time.  Psychiatric:        Mood and Affect: Mood normal.        Behavior: Behavior normal. Behavior is cooperative.        Thought Content: Thought content normal.        Judgment: Judgment normal.      UC Treatments / Results  Labs (all labs ordered are listed, but only abnormal results are displayed) Labs Reviewed - No data to display  EKG   Radiology No results found.  Procedures Procedures (including  critical care time)  Medications Ordered in UC Medications - No  data to display  Initial Impression / Assessment and Plan / UC Course  I have reviewed the triage vital signs and the nursing notes.  Pertinent labs & imaging results that were available during my care of the patient were reviewed by me and considered in my medical decision making (see chart for details).      Final Clinical Impressions(s) / UC Diagnoses   Final diagnoses:  Flu-like symptoms  Influenza-like illness   Acute, new concern Patient with a previous history of COPD and emphysema presents today with concerns for fever, chills, body aches, dry cough, shortness of breath for the past 1 to 2 days He reports that he has not had to use his albuterol inhaler today At this time I suspect that he likely has influenza.  Will send in prescription for Tamiflu p.o. twice daily x 5 days.  Tamiflu is indicated due to his previous medical history and concern for potential exacerbation developing due to URI symptoms Recommend he continues to use over-the-counter medications such as DayQuil/NyQuil for symptomatic management.  Reviewed that he should use his albuterol inhaler as needed for shortness of breath and wheezing.  Reviewed that he should go to the emergency room should he develop worsening shortness of breath, productive cough, fevers that are not improved with antipyretics Follow-up as needed    Discharge Instructions      At this time I suspect that you likely have the flu.  I have sent in a medication called Tamiflu for you to take twice per day for the next 5 days.  This medication will help reduce the severity of your symptoms and hopefully reduce how long you are feeling bad.  You can also continue to take DayQuil/NyQuil as needed to help control symptoms.  If you notice that your breathing becomes worse, you develop fevers that are not responding to medications, confusion, feeling weak or fatigued please go to the  emergency room for prompt evaluation and management Please try to use a GoodRx coupon to assist with medication coverage At the end of your paperwork there is a link for you to scan with your phone that can help you get established with a PCP.  This would be beneficial as they can help set you up with medication assistance and manage your chronic medical conditions more closely.     ED Prescriptions     Medication Sig Dispense Auth. Provider   oseltamivir (TAMIFLU) 75 MG capsule Take 1 capsule (75 mg total) by mouth 2 (two) times daily for 5 days. 10 capsule Tamkia Temples E, PA-C      PDMP not reviewed this encounter.   Aeryn Medici, Oswaldo Conroy, PA-C 02/07/23 2056    Mrk Buzby, Oswaldo Conroy, PA-C 02/07/23 2057

## 2023-06-08 ENCOUNTER — Ambulatory Visit (HOSPITAL_COMMUNITY): Admission: EM | Admit: 2023-06-08 | Discharge: 2023-06-08 | Discharge status: Against Medical Advice

## 2023-06-08 NOTE — ED Notes (Signed)
No answer from lobby  

## 2023-06-08 NOTE — ED Notes (Signed)
 Patient came in from out side asking patient access if he had been called yet. Patient informed that he had been called once with no answer and will be tried again before we take him out of the system.  Patient complained about it being hot in the lobby to registration and informed them that he was going down to the hospital where he could "wait in the air conditioning".

## 2023-06-29 ENCOUNTER — Other Ambulatory Visit (HOSPITAL_COMMUNITY): Payer: Self-pay

## 2023-06-29 ENCOUNTER — Encounter (HOSPITAL_COMMUNITY): Payer: Self-pay

## 2023-06-29 ENCOUNTER — Ambulatory Visit (HOSPITAL_COMMUNITY)
Admission: EM | Admit: 2023-06-29 | Discharge: 2023-06-29 | Disposition: A | Discharge status: Home / Self Care | Attending: Internal Medicine | Admitting: Internal Medicine

## 2023-06-29 DIAGNOSIS — L089 Local infection of the skin and subcutaneous tissue, unspecified: Secondary | ICD-10-CM | POA: Diagnosis not present

## 2023-06-29 DIAGNOSIS — L03114 Cellulitis of left upper limb: Secondary | ICD-10-CM | POA: Diagnosis not present

## 2023-06-29 DIAGNOSIS — T148XXA Other injury of unspecified body region, initial encounter: Secondary | ICD-10-CM | POA: Diagnosis not present

## 2023-06-29 MED ORDER — MUPIROCIN 2 % EX OINT
1.0000 | TOPICAL_OINTMENT | Freq: Two times a day (BID) | CUTANEOUS | 0 refills | Status: DC
  Filled 2023-06-29: qty 22, 11d supply, fill #0

## 2023-06-29 MED ORDER — AMOXICILLIN-POT CLAVULANATE 875-125 MG PO TABS
1.0000 | ORAL_TABLET | Freq: Two times a day (BID) | ORAL | 0 refills | Status: DC
  Filled 2023-06-29: qty 14, 7d supply, fill #0

## 2023-06-29 NOTE — ED Provider Notes (Signed)
 [facility]    CSN: [id number] Arrival date & time: 06/29/23  1104      History   Chief Complaint Chief Complaint  Patient presents with   Wound Check    HPI Andres Harding is a 58 y.o. male.   Andres Harding is a 58 y.o. male presenting for chief complaint of infection of the left wrist. He noticed a bump to the left wrist 2 weeks ago.  The lesion has grown significantly in size, pain, and redness/swelling in the last 2 days since he used a dirty knife to pick at the lesion and attempt to drain/pop the bump.  Last tetanus injection was 6 months ago in November 2024.  He denies drainage from the lesion, streaking redness to the left wrist/hand, fever, chills, body aches, nausea/vomiting.  No recent antibiotic or steroid use in the last 90 days reported.  He denies recent trauma/injury to the left wrist to cause initial injury/bump.  No numbness or tingling of the left hand.  He has not attempted use of any over-the-counter medications to help with symptoms PTA.   Wound Check    Past Medical History:  Diagnosis Date   Asthma    Back pain    Bronchitis    Cancer (HCC)    COPD (chronic obstructive pulmonary disease) (HCC)    Emphysema lung (HCC)    Emphysema of lung (HCC)    Emphysema of lung (HCC)     There are no active problems to display for this patient.   History reviewed. No pertinent surgical history.     Home Medications    Prior to Admission medications   Medication Sig Start Date End Date Taking? Authorizing Provider  amoxicillin -clavulanate (AUGMENTIN ) 875-125 MG tablet Take 1 tablet by mouth every 12 (twelve) hours. 06/29/23  Yes Enedelia Dorna HERO, FNP  mupirocin ointment (BACTROBAN) 2 % Apply 1 Application topically 2 (two) times daily. 06/29/23  Yes Enedelia Dorna HERO, FNP  albuterol  (VENTOLIN  HFA) 108 (90 Base) MCG/ACT inhaler Inhale 2 puffs into the lungs every 6 (six) hours as needed for wheezing or shortness of breath.  11/18/22   LampteyAleene KIDD, MD    Family History History reviewed. No pertinent family history.  Social History Social History   Tobacco Use   Smoking status: Every Day    Current packs/day: 0.80    Types: Cigarettes   Smokeless tobacco: Never  Vaping Use   Vaping status: Never Used  Substance Use Topics   Alcohol use: Yes    Comment: occasional   Drug use: Yes    Types: Methamphetamines, IV    Comment: heroin; crystal meth     Allergies   Patient has no known allergies.   Review of Systems Review of Systems Per HPI  Physical Exam Triage Vital Signs ED Triage Vitals  Encounter Vitals Group     BP 06/29/23 1136 (!) 147/88     Girls Systolic BP Percentile --      Girls Diastolic BP Percentile --      Boys Systolic BP Percentile --      Boys Diastolic BP Percentile --      Pulse Rate 06/29/23 1136 93     Resp 06/29/23 1136 19     Temp 06/29/23 1136 97.7 F (36.5 C)     Temp src --      SpO2 06/29/23 1136 97 %     Weight --      Height --  Head Circumference --      Peak Flow --      Pain Score 06/29/23 1133 2     Pain Loc --      Pain Education --      Exclude from Growth Chart --    No data found.  Updated Vital Signs BP (!) 147/88   Pulse 93   Temp 97.7 F (36.5 C)   Resp 19   SpO2 97%   Visual Acuity Right Eye Distance:   Left Eye Distance:   Bilateral Distance:    Right Eye Near:   Left Eye Near:    Bilateral Near:     Physical Exam Vitals and nursing note reviewed.  Constitutional:      Appearance: He is not ill-appearing or toxic-appearing.  HENT:     Head: Normocephalic and atraumatic.     Right Ear: Hearing and external ear normal.     Left Ear: Hearing and external ear normal.     Nose: Nose normal.     Mouth/Throat:     Lips: Pink.   Eyes:     General: Lids are normal. Vision grossly intact. Gaze aligned appropriately.     Extraocular Movements: Extraocular movements intact.     Conjunctiva/sclera: Conjunctivae  normal.   Pulmonary:     Effort: Pulmonary effort is normal.   Musculoskeletal:     Cervical back: Neck supple.   Skin:    General: Skin is warm and dry.     Capillary Refill: Capillary refill takes less than 2 seconds.     Findings: Lesion (1cm by 2cm wound to the base of the left thumb as seen in image below.) present. No rash.     Comments: Lesion of the base of the left thumb- open wound, non-draining, tender to palpation. Erythema surrounding wound.  +2 left radial pulse. Less than 2 cap refill. Sensation and strength intact distally.    Neurological:     General: No focal deficit present.     Mental Status: He is alert and oriented to person, place, and time. Mental status is at baseline.     Cranial Nerves: No dysarthria or facial asymmetry.   Psychiatric:        Mood and Affect: Mood normal.        Speech: Speech normal.        Behavior: Behavior normal.        Thought Content: Thought content normal.        Judgment: Judgment normal.    Left wrist   UC Treatments / Results  Labs (all labs ordered are listed, but only abnormal results are displayed) Labs Reviewed - No data to display  EKG   Radiology No results found.  Procedures Procedures (including critical care time)  Medications Ordered in UC Medications - No data to display  Initial Impression / Assessment and Plan / UC Course  I have reviewed the triage vital signs and the nursing notes.  Pertinent labs & imaging results that were available during my care of the patient were reviewed by me and considered in my medical decision making (see chart for details).   1. Cellulitis of left wrist, infected open wound Wound of the left wrist is infected.  Augmentin  BID for 7 days, mupirocin ointment topically BID for 7 days, wound care discussed. Infection return precautions discussed. No systemic signs of infection or lymphangitis.   Counseled patient on potential for adverse effects with medications  prescribed/recommended today, strict ER  and return-to-clinic precautions discussed, patient verbalized understanding.    Final Clinical Impressions(s) / UC Diagnoses   Final diagnoses:  Cellulitis of left wrist  Infected open wound     Discharge Instructions      You have cellulitis which is a superficial skin infection.  Take antibiotic every 12 hours with a snack/food for the next 7 days. Mupirocin ointment every 12 hours for the next 7 days.  Watch for worsening signs of infection to the lesion such as spreading redness, swelling, pus drainage, pain, fever/chills. Return if you develop fever, chills, nausea, vomiting, etc as well.   If you develop any new or worsening symptoms or if your symptoms do not start to improve, please return here or follow-up with your primary care provider. If your symptoms are severe, please go to the emergency room.     ED Prescriptions     Medication Sig Dispense Auth. Provider   amoxicillin -clavulanate (AUGMENTIN ) 875-125 MG tablet Take 1 tablet by mouth every 12 (twelve) hours. 14 tablet Enedelia Dorna HERO, FNP   mupirocin ointment (BACTROBAN) 2 % Apply 1 Application topically 2 (two) times daily. 22 g Enedelia Dorna HERO, FNP      PDMP not reviewed this encounter.   Enedelia Dorna HERO, [place] 06/29/23 1229

## 2023-06-29 NOTE — Discharge Instructions (Addendum)
 You have cellulitis which is a superficial skin infection.  Take antibiotic every 12 hours with a snack/food for the next 7 days. Mupirocin ointment every 12 hours for the next 7 days.  Watch for worsening signs of infection to the lesion such as spreading redness, swelling, pus drainage, pain, fever/chills. Return if you develop fever, chills, nausea, vomiting, etc as well.   If you develop any new or worsening symptoms or if your symptoms do not start to improve, please return here or follow-up with your primary care provider. If your symptoms are severe, please go to the emergency room.

## 2023-06-29 NOTE — ED Triage Notes (Signed)
 Pt had a bump pop on his left hand, in the palm area, 2 weeks ago. He wanted to try to pop it and used a knife to do that 2 days ago. Area is red reports bleeding from the area at home.

## 2023-09-25 ENCOUNTER — Emergency Department (HOSPITAL_COMMUNITY)

## 2023-09-25 ENCOUNTER — Emergency Department (HOSPITAL_COMMUNITY)
Admission: EM | Admit: 2023-09-25 | Discharge: 2023-09-25 | Disposition: A | Discharge status: Home / Self Care | Attending: Emergency Medicine | Admitting: Emergency Medicine

## 2023-09-25 ENCOUNTER — Encounter (HOSPITAL_COMMUNITY): Payer: Self-pay | Admitting: Emergency Medicine

## 2023-09-25 ENCOUNTER — Other Ambulatory Visit: Payer: Self-pay

## 2023-09-25 DIAGNOSIS — M5023 Other cervical disc displacement, cervicothoracic region: Secondary | ICD-10-CM | POA: Diagnosis not present

## 2023-09-25 DIAGNOSIS — Z743 Need for continuous supervision: Secondary | ICD-10-CM | POA: Diagnosis not present

## 2023-09-25 DIAGNOSIS — S161XXA Strain of muscle, fascia and tendon at neck level, initial encounter: Secondary | ICD-10-CM | POA: Insufficient documentation

## 2023-09-25 DIAGNOSIS — J449 Chronic obstructive pulmonary disease, unspecified: Secondary | ICD-10-CM | POA: Insufficient documentation

## 2023-09-25 DIAGNOSIS — M542 Cervicalgia: Secondary | ICD-10-CM | POA: Diagnosis not present

## 2023-09-25 DIAGNOSIS — N289 Disorder of kidney and ureter, unspecified: Secondary | ICD-10-CM | POA: Insufficient documentation

## 2023-09-25 DIAGNOSIS — M4802 Spinal stenosis, cervical region: Secondary | ICD-10-CM | POA: Insufficient documentation

## 2023-09-25 DIAGNOSIS — F17200 Nicotine dependence, unspecified, uncomplicated: Secondary | ICD-10-CM | POA: Insufficient documentation

## 2023-09-25 DIAGNOSIS — W19XXXA Unspecified fall, initial encounter: Secondary | ICD-10-CM | POA: Diagnosis not present

## 2023-09-25 DIAGNOSIS — M4803 Spinal stenosis, cervicothoracic region: Secondary | ICD-10-CM | POA: Diagnosis not present

## 2023-09-25 DIAGNOSIS — X509XXA Other and unspecified overexertion or strenuous movements or postures, initial encounter: Secondary | ICD-10-CM | POA: Insufficient documentation

## 2023-09-25 DIAGNOSIS — M50321 Other cervical disc degeneration at C4-C5 level: Secondary | ICD-10-CM | POA: Diagnosis not present

## 2023-09-25 DIAGNOSIS — M4312 Spondylolisthesis, cervical region: Secondary | ICD-10-CM | POA: Diagnosis not present

## 2023-09-25 LAB — CBC WITH DIFFERENTIAL/PLATELET
Abs Immature Granulocytes: 0.01 K/uL (ref 0.00–0.07)
Basophils Absolute: 0 K/uL (ref 0.0–0.1)
Basophils Relative: 1 %
Eosinophils Absolute: 0.1 K/uL (ref 0.0–0.5)
Eosinophils Relative: 1 %
HCT: 41.3 % (ref 39.0–52.0)
Hemoglobin: 13.4 g/dL (ref 13.0–17.0)
Immature Granulocytes: 0 %
Lymphocytes Relative: 20 %
Lymphs Abs: 0.9 K/uL (ref 0.7–4.0)
MCH: 27.9 pg (ref 26.0–34.0)
MCHC: 32.4 g/dL (ref 30.0–36.0)
MCV: 85.9 fL (ref 80.0–100.0)
Monocytes Absolute: 0.7 K/uL (ref 0.1–1.0)
Monocytes Relative: 15 %
Neutro Abs: 2.8 K/uL (ref 1.7–7.7)
Neutrophils Relative %: 63 %
Platelets: 208 K/uL (ref 150–400)
RBC: 4.81 MIL/uL (ref 4.22–5.81)
RDW: 14.2 % (ref 11.5–15.5)
WBC: 4.5 K/uL (ref 4.0–10.5)
nRBC: 0 % (ref 0.0–0.2)

## 2023-09-25 LAB — COMPREHENSIVE METABOLIC PANEL WITH GFR
ALT: 54 U/L — ABNORMAL HIGH (ref 0–44)
AST: 53 U/L — ABNORMAL HIGH (ref 15–41)
Albumin: 3.6 g/dL (ref 3.5–5.0)
Alkaline Phosphatase: 58 U/L (ref 38–126)
Anion gap: 9 (ref 5–15)
BUN: 27 mg/dL — ABNORMAL HIGH (ref 6–20)
CO2: 24 mmol/L (ref 22–32)
Calcium: 9.4 mg/dL (ref 8.9–10.3)
Chloride: 100 mmol/L (ref 98–111)
Creatinine, Ser: 1.78 mg/dL — ABNORMAL HIGH (ref 0.61–1.24)
GFR, Estimated: 43 mL/min — ABNORMAL LOW (ref >60)
Glucose, Bld: 90 mg/dL (ref 70–99)
Potassium: 4.2 mmol/L (ref 3.5–5.1)
Sodium: 133 mmol/L — ABNORMAL LOW (ref 135–145)
Total Bilirubin: 0.4 mg/dL (ref 0.0–1.2)
Total Protein: 6.8 g/dL (ref 6.5–8.1)

## 2023-09-25 LAB — RESP PANEL BY RT-PCR (RSV, FLU A&B, COVID)  RVPGX2
Influenza A by PCR: NEGATIVE
Influenza B by PCR: NEGATIVE
Resp Syncytial Virus by PCR: NEGATIVE
SARS Coronavirus 2 by RT PCR: NEGATIVE

## 2023-09-25 MED ORDER — METHYLPREDNISOLONE 4 MG PO TBPK: ORAL_TABLET | ORAL | 0 refills | Status: DC

## 2023-09-25 MED ORDER — ALBUTEROL SULFATE HFA 108 (90 BASE) MCG/ACT IN AERS
2.0000 | INHALATION_SPRAY | Freq: Once | RESPIRATORY_TRACT | Status: AC
  Administered 2023-09-25: 2 via RESPIRATORY_TRACT
  Filled 2023-09-25: qty 6.7

## 2023-09-25 MED ORDER — ALBUTEROL SULFATE HFA 108 (90 BASE) MCG/ACT IN AERS: 1.0000 | INHALATION_SPRAY | Freq: Four times a day (QID) | RESPIRATORY_TRACT | 0 refills | Status: AC | PRN

## 2023-09-25 MED ORDER — SODIUM CHLORIDE 0.9 % IV BOLUS
1000.0000 mL | Freq: Once | INTRAVENOUS | Status: AC
  Administered 2023-09-25: 1000 mL via INTRAVENOUS

## 2023-09-25 NOTE — ED Provider Notes (Signed)
 [place] EMERGENCY DEPARTMENT AT [facility] Provider Note   CSN: [id number] Arrival date & time: 09/25/23  0109     Patient presents with: Neck Pain   Andres Harding is a 58 y.o. male.   The history is provided by the patient.  Neck Pain Andres Harding is a 58 y.o. male who presents to the Emergency Department complaining of neck pain. He presents the emergency department for evaluation of acute posterior neck pain that occurred when he bent down earlier today. He felt like he felt a pop in the back of his neck. No associated numbness, weakness. Pain is worse when he flexes and rotates his neck. He also complains of chronic pain to his right clavicle after breaking it not being able to get follow-up. He does have a history of COPD/asthma, no additional medical problems. He does use tobacco. No alcohol. He does use drugs, last used one week ago. No fevers, chills, night sweats.     Prior to Admission medications   Medication Sig Start Date End Date Taking? Authorizing Provider  albuterol  (VENTOLIN  HFA) 108 (90 Base) MCG/ACT inhaler Inhale 1-2 puffs into the lungs every 6 (six) hours as needed for wheezing or shortness of breath. 09/25/23  Yes Griselda Norris, MD  methylPREDNISolone  (MEDROL  DOSEPAK) 4 MG TBPK tablet Take according to label instructions 09/25/23  Yes Griselda Norris, MD  amoxicillin -clavulanate (AUGMENTIN ) 875-125 MG tablet Take 1 tablet by mouth every 12 (twelve) hours. 06/29/23   Enedelia Dorna HERO, FNP  mupirocin  ointment (BACTROBAN ) 2 % Apply 1 Application topically 2 (two) times daily. 06/29/23   Enedelia Dorna HERO, FNP    Allergies: Patient has no known allergies.    Review of Systems  Musculoskeletal:  Positive for neck pain.  All other systems reviewed and are negative.   Updated Vital Signs BP (!) 142/86 (BP Location: Left Arm)   Pulse 92   Temp 98 F (36.7 C) (Oral)   Resp 16   Ht 6' 5 (1.956 m)   Wt 81.6 kg   SpO2 99%   BMI  21.34 kg/m   Physical Exam Vitals and nursing note reviewed.  Constitutional:      Appearance: He is well-developed.  HENT:     Head: Normocephalic and atraumatic.  Neck:     Comments: There is no midline bony tenderness to the cervical spine but there is pain with range of motion. Cardiovascular:     Rate and Rhythm: Normal rate and regular rhythm.     Heart sounds: No murmur heard. Pulmonary:     Effort: Pulmonary effort is normal. No respiratory distress.     Breath sounds: Normal breath sounds.  Abdominal:     Palpations: Abdomen is soft.     Tenderness: There is no abdominal tenderness. There is no guarding or rebound.  Musculoskeletal:        General: No tenderness.     Comments: Chronic appearing in deformity over the right clavicle, no soft tissue swelling this area, no focal tenderness.  Skin:    General: Skin is warm and dry.  Neurological:     Mental Status: He is alert and oriented to person, place, and time.     Comments: Five out of five strength in all four extremities with sensational light touch intact in all four extremities  Psychiatric:        Behavior: Behavior normal.     (all labs ordered are listed, but only abnormal results are displayed) Labs  Reviewed  COMPREHENSIVE METABOLIC PANEL WITH GFR - Abnormal; Notable for the following components:      Result Value   Sodium 133 (*)    BUN 27 (*)    Creatinine, Ser 1.78 (*)    AST 53 (*)    ALT 54 (*)    GFR, Estimated 43 (*)    All other components within normal limits  RESP PANEL BY RT-PCR (RSV, FLU A&B, COVID)  [id number]  CBC WITH DIFFERENTIAL/PLATELET    EKG: None  Radiology: MR Cervical Spine Wo Contrast Result Date: 09/25/2023 CLINICAL DATA:  58 year old male with recurrent right neck pain following clavicle fracture last year. Acutely felt a pop when turning head. EXAM: MRI CERVICAL SPINE WITHOUT CONTRAST TECHNIQUE: Multiplanar, multisequence MR imaging of the cervical spine was performed.  No intravenous contrast was administered. COMPARISON:  Cervical spine CT today. FINDINGS: Alignment: Stable reversal of the normal lordosis from earlier CT. Vertebrae: Normal background bone marrow signal with confluent degenerative marrow signal changes C4 and C5, consisting of combined sclerosis (by CT) and patchy degenerative appearing marrow edema affecting both the vertebral bodies and posterior elements (series 7, images 8 and 13). Vacuum disc at those levels by CT. No other No acute osseous abnormality identified. Cord: No cervical spinal cord signal abnormality is identified. Up to mild degenerative cord mass effect, detailed below. Posterior Fossa, vertebral arteries, paraspinal tissues: Cervicomedullary junction is within normal limits. Negative visible posterior fossa. Preserved major vascular flow voids in the bilateral neck. Negative visible neck soft tissues. Disc levels: C2-C3:  Degenerative ankylosis as seen by CT. C3-C4: Mild anterolisthesis. Mild disc bulging. More advanced endplate spurring and moderate to severe facet hypertrophy which is greater on the left. Ligament flavum hypertrophy and heterogeneity. Mild spinal stenosis. No convincing cord mass effect. Severe left and moderate to severe right C4 foraminal stenosis. C4-C5: Advanced disc and endplate degeneration. Vacuum disc here by CT. Circumferential disc osteophyte complex and less pronounced posterior element hypertrophy. Mild spinal stenosis. Mild ventral cord mass effect (series 8, image 23). Moderate to severe bilateral C5 foraminal stenosis. C5-C6: Similar advanced disc and endplate degeneration. Circumferential disc osteophyte complex asymmetric to the left. Mild spinal stenosis. No convincing cord mass effect. Moderate to severe left greater than right C6 foraminal stenosis. C6-C7: Similar advanced disc and endplate degeneration. Circumferential disc osteophyte complex which is more symmetric. Mild facet and ligament flavum  hypertrophy. Mild spinal stenosis, no convincing cord mass effect. Moderate to severe left greater than right C7 foraminal stenosis. C7-T1: Circumferential disc bulge and endplate spurring. Moderate facet and ligament flavum hypertrophy. No spinal stenosis. Moderate left and mild right C8 foraminal stenosis. No visible upper thoracic spinal stenosis. IMPRESSION: 1. Advanced chronic cervical spine degeneration superimposed on degenerative ankylosis of C2-C3. 2. Confluent degenerative marrow signal changes at C4 and C5, including patchy marrow edema. No suspicious osseous lesion. 3. Mild multifactorial spinal stenosis C3-C4 through C6-C7. Up to mild associated spinal cord mass effect (C4-C5). No cord signal changes. Moderate to severe bilateral foraminal stenosis at each level, generally more pronounced on the left. Moderate degenerative left foraminal stenosis at C7-T1. Electronically Signed   By: VEAR Hurst M.D.   On: 09/25/2023 05:50   CT Cervical Spine Wo Contrast Result Date: 09/25/2023 CLINICAL DATA:  58 year old male with recurrent right neck pain following clavicle fracture last year. Acutely felt a pop when turning head. EXAM: CT CERVICAL SPINE WITHOUT CONTRAST TECHNIQUE: Multidetector CT imaging of the cervical spine was performed without intravenous contrast. Multiplanar  CT image reconstructions were also generated. RADIATION DOSE REDUCTION: This exam was performed according to the departmental dose-optimization program which includes automated exposure control, adjustment of the mA and/or kV according to patient size and/or use of iterative reconstruction technique. COMPARISON:  Head CT 07/16/2014. FINDINGS: Alignment: Reversal of the normal cervical lordosis (series 10, image 33) with degenerative appearing anterolisthesis of C3 on C4. Cervicothoracic junction alignment is within normal limits. Maintained posterior element alignment. Skull base and vertebrae: Normal background bone mineralization.  Visualized skull base is intact. No atlanto-occipital dissociation. C1-C2 appear intact aligned. No acute osseous abnormality identified. Soft tissues and spinal canal: No prevertebral fluid or swelling. No visible canal hematoma. Visible noncontrast neck soft tissues appear negative aside from mild carotid bifurcation calcified atherosclerosis. Disc levels: Degenerative ankylosis of the C2-C3 facets. Anterolisthesis of C3 on C4 with moderate to severe facet arthropathy on the left. Disc bulging and endplate spurring. Severe left and moderate to severe right C4 foraminal stenosis. Possible mild spinal stenosis. C4-C5 through C6-C7 severe disc space loss with vacuum disc. Circumferential endplate spurring at each level, mildly asymmetric. Possible mild spinal stenosis. Moderate to severe right C5, left C6 and left C7 neural foraminal stenosis. C7-T1 disc bulging, endplate spurring and left side moderate facet hypertrophy. Moderate left C8 foraminal stenosis. Upper chest: Severe emphysema in the lung apices with partially calcified and nodular architectural distortion on the left. Visible upper thoracic levels appear intact. Other: Cervicomedullary junction is within normal limits. Negative visible noncontrast brain parenchyma. Visualized paranasal sinuses, tympanic cavities, and mastoids are stable and well aerated. IMPRESSION: 1.  No acute osseous abnormality in the cervical spine. 2. Advanced cervical spine degeneration superimposed on degenerative C2-C3 facet ankylosis. C3-C4 spondylolisthesis with severe facet arthropathy on the left, severe left C4 foraminal stenosis. Chronic severe disc and endplate degeneration C4-C5 through C6-C7 with multilevel moderate to severe foraminal stenosis mostly on the left. Multilevel mild cervical spinal stenosis is possible. 3. Advanced  Emphysema (ICD10-J43.9) in the visible lung apices. Electronically Signed   By: VEAR Hurst M.D.   On: 09/25/2023 03:58     Procedures    Medications Ordered in the ED  sodium chloride  0.9 % bolus 1,000 mL (0 mLs Intravenous Stopped 09/25/23 0749)  albuterol  (VENTOLIN  HFA) 108 (90 Base) MCG/ACT inhaler 2 puff (2 puffs Inhalation Given 09/25/23 0732)                                    Medical Decision Making Amount and/or Complexity of Data Reviewed Labs: ordered. Radiology: ordered.  Risk Prescription drug management.   Patient with history of COPD/asthma, substance use disorder here for evaluation of acute neck pain after bending to pick up something. He has no focal neurologic deficits on examination but does have significant pain with range of motion. CT scan with degenerative changes. Given description of symptoms and MRI was obtained, no evidence of ligamentous injury, infectious process. Labs are significant for AKI. He was treated with IV fluids. He does report that he recently had significant diarrhea, now improved. Discussed with patient home care for cervical stenosis, AKI.  Discussed need for PCP follow-up - he does not currently have a PCP. Will place case management referral. Will provide Medrol  Dosepak.     Final diagnoses:  Strain of neck muscle, initial encounter  Cervical stenosis of spinal canal  Renal insufficiency    ED Discharge Orders  Ordered    albuterol  (VENTOLIN  HFA) 108 (90 Base) MCG/ACT inhaler  Every 6 hours PRN        09/25/23 0623    methylPREDNISolone  (MEDROL  DOSEPAK) 4 MG TBPK tablet        09/25/23 9376               Griselda Norris, MD 09/25/23 [phone]

## 2023-09-25 NOTE — ED Triage Notes (Signed)
 Patient reports recurrent right clavicle pain since November 2024 when he fractured it. Reports he felt a pop when turning his head. Now he is having right clavicle and neck pain. Full ROM. Denies numbness/tingling.

## 2023-09-25 NOTE — ED Notes (Signed)
 Patient transported to CT

## 2024-01-06 ENCOUNTER — Telehealth (HOSPITAL_COMMUNITY): Payer: Self-pay

## 2024-01-06 ENCOUNTER — Encounter (HOSPITAL_COMMUNITY): Payer: Self-pay

## 2024-01-06 ENCOUNTER — Other Ambulatory Visit (HOSPITAL_COMMUNITY): Payer: Self-pay

## 2024-01-06 ENCOUNTER — Ambulatory Visit (HOSPITAL_COMMUNITY)
Admission: EM | Admit: 2024-01-06 | Discharge: 2024-01-06 | Disposition: A | Discharge status: Home / Self Care | Attending: Physician Assistant | Admitting: Physician Assistant

## 2024-01-06 ENCOUNTER — Ambulatory Visit (INDEPENDENT_AMBULATORY_CARE_PROVIDER_SITE_OTHER)

## 2024-01-06 DIAGNOSIS — M25532 Pain in left wrist: Secondary | ICD-10-CM | POA: Insufficient documentation

## 2024-01-06 DIAGNOSIS — Z113 Encounter for screening for infections with a predominantly sexual mode of transmission: Secondary | ICD-10-CM | POA: Diagnosis not present

## 2024-01-06 DIAGNOSIS — L299 Pruritus, unspecified: Secondary | ICD-10-CM | POA: Insufficient documentation

## 2024-01-06 LAB — COMPREHENSIVE METABOLIC PANEL WITH GFR
ALT: 21 U/L (ref 0–44)
AST: 31 U/L (ref 15–41)
Albumin: 4.7 g/dL (ref 3.5–5.0)
Alkaline Phosphatase: 80 U/L (ref 38–126)
Anion gap: 14 (ref 5–15)
BUN: 29 mg/dL — ABNORMAL HIGH (ref 6–20)
CO2: 24 mmol/L (ref 22–32)
Calcium: 9.9 mg/dL (ref 8.9–10.3)
Chloride: 99 mmol/L (ref 98–111)
Creatinine, Ser: 1.28 mg/dL — ABNORMAL HIGH (ref 0.61–1.24)
GFR, Estimated: >60 mL/min
Glucose, Bld: 83 mg/dL (ref 70–99)
Potassium: 4.7 mmol/L (ref 3.5–5.1)
Sodium: 138 mmol/L (ref 135–145)
Total Bilirubin: 0.2 mg/dL (ref 0.0–1.2)
Total Protein: 8.2 g/dL — ABNORMAL HIGH (ref 6.5–8.1)

## 2024-01-06 LAB — CBC WITH DIFFERENTIAL/PLATELET
Abs Immature Granulocytes: 0.03 K/uL (ref 0.00–0.07)
Basophils Absolute: 0.1 K/uL (ref 0.0–0.1)
Basophils Relative: 1 %
Eosinophils Absolute: 0.4 K/uL (ref 0.0–0.5)
Eosinophils Relative: 5 %
HCT: 47.9 % (ref 39.0–52.0)
Hemoglobin: 15.7 g/dL (ref 13.0–17.0)
Immature Granulocytes: 0 %
Lymphocytes Relative: 26 %
Lymphs Abs: 1.8 K/uL (ref 0.7–4.0)
MCH: 28 pg (ref 26.0–34.0)
MCHC: 32.8 g/dL (ref 30.0–36.0)
MCV: 85.5 fL (ref 80.0–100.0)
Monocytes Absolute: 0.7 K/uL (ref 0.1–1.0)
Monocytes Relative: 10 %
Neutro Abs: 3.9 K/uL (ref 1.7–7.7)
Neutrophils Relative %: 58 %
Platelets: 266 K/uL (ref 150–400)
RBC: 5.6 MIL/uL (ref 4.22–5.81)
RDW: 14.2 % (ref 11.5–15.5)
WBC: 6.9 K/uL (ref 4.0–10.5)
nRBC: 0 % (ref 0.0–0.2)

## 2024-01-06 LAB — TSH: TSH: 1.94 u[IU]/mL (ref 0.350–4.500)

## 2024-01-06 LAB — HEPATITIS C ANTIBODY: HCV Ab: NONREACTIVE

## 2024-01-06 MED ORDER — PREDNISONE 10 MG (21) PO TBPK: ORAL_TABLET | ORAL | 0 refills | Status: DC

## 2024-01-06 MED ORDER — PREDNISONE 10 MG PO TABS
ORAL_TABLET | ORAL | 0 refills | Status: AC
  Filled 2024-01-06: qty 21, 10d supply, fill #0

## 2024-01-06 NOTE — ED Provider Notes (Signed)
 " [facility]    CSN: [id number] Arrival date & time: 01/06/24  1208      History   Chief Complaint No chief complaint on file.   HPI Andres Harding is a 59 y.o. male.   Patient presents today with several concerns.  His primary concern today is a 85-month history of left wrist pain and swelling.  He denies any known injury or increased activity before symptoms began.  He is primarily right-handed but reports that he does use his left hand for a variety of activities.  Denies any associated numbness or paresthesias but is having difficulty using the hand because it is so painful.  He reports that pain is rated 6 on a 0-10 pain scale, localized to volar wrist, described as throbbing, no alleviating factors notified.  He denies any increased morning pain or stiffness.  He does have a history of osteoarthritis but denies formal diagnosis of gout or rheumatoid arthritis.  He has not been taking any medication for symptom management.  Denies any fever, weight loss, nausea, vomiting.  Patient is interested in STI panel.  He specifically requests hepatitis C testing though he had a negative screening in 2019.  He does report widespread pruritus but is unsure if that is related to dry skin in the wintertime or something else.  He denies any jaundice, nausea, vomiting.  He was seen in the emergency room in September 2025 at which point he had chronic kidney disease and slightly elevated transaminases.  He recently got Medicaid and so has not yet established with a primary care provider.    Past Medical History:  Diagnosis Date   Asthma    Back pain    Bronchitis    Cancer (HCC)    COPD (chronic obstructive pulmonary disease) (HCC)    Emphysema lung (HCC)    Emphysema of lung (HCC)    Emphysema of lung (HCC)     There are no active problems to display for this patient.   History reviewed. No pertinent surgical history.     Home Medications    Prior to Admission  medications  Medication Sig Start Date End Date Taking? Authorizing Provider  predniSONE  (STERAPRED UNI-PAK 21 TAB) 10 MG (21) TBPK tablet Take 40 mg (4 tablets) days 1 and 2, take 30 mg (3 tablets) days 3 and 4, take 20 mg (2 tablets) days 5 and 6, take 10 mg (1 tablet) day 7 and 8, take 5 mg (0.5 tablet) day 9 and 10, then stop. 01/06/24  Yes Shanitra Phillippi, Rocky POUR, PA-C  albuterol  (VENTOLIN  HFA) 108 (90 Base) MCG/ACT inhaler Inhale 1-2 puffs into the lungs every 6 (six) hours as needed for wheezing or shortness of breath. 09/25/23   Griselda Norris, MD    Family History History reviewed. No pertinent family history.  Social History Social History[1]   Allergies   Patient has no known allergies.   Review of Systems Review of Systems  Constitutional:  Positive for activity change. Negative for appetite change, fatigue and fever.  Respiratory:  Positive for shortness of breath (chronic at baseline). Negative for cough.   Cardiovascular:  Negative for chest pain.  Gastrointestinal:  Negative for abdominal pain, diarrhea, nausea and vomiting.  Musculoskeletal:  Positive for arthralgias and joint swelling. Negative for myalgias.  Skin:  Negative for color change and wound.  Neurological:  Negative for weakness and numbness.     Physical Exam Triage Vital Signs ED Triage Vitals  Encounter Vitals Group  BP 01/06/24 1322 (!) 131/90     Girls Systolic BP Percentile --      Girls Diastolic BP Percentile --      Boys Systolic BP Percentile --      Boys Diastolic BP Percentile --      Pulse Rate 01/06/24 1322 100     Resp 01/06/24 1322 16     Temp 01/06/24 1322 98 F (36.7 C)     Temp Source 01/06/24 1322 Oral     SpO2 01/06/24 1322 94 %     Weight --      Height --      Head Circumference --      Peak Flow --      Pain Score 01/06/24 1319 5     Pain Loc --      Pain Education --      Exclude from Growth Chart --    No data found.  Updated Vital Signs BP (!) 131/90 (BP Location:  Right Arm)   Pulse 100   Temp 98 F (36.7 C) (Oral)   Resp 16   SpO2 94%   Visual Acuity Right Eye Distance:   Left Eye Distance:   Bilateral Distance:    Right Eye Near:   Left Eye Near:    Bilateral Near:     Physical Exam Vitals reviewed.  Constitutional:      General: He is awake.     Appearance: Normal appearance. He is well-developed. He is not ill-appearing.     Comments: Very pleasant male appears stated age in no acute distress sitting comfortably in exam room  HENT:     Head: Normocephalic and atraumatic.     Mouth/Throat:     Pharynx: No oropharyngeal exudate, posterior oropharyngeal erythema or uvula swelling.  Cardiovascular:     Rate and Rhythm: Normal rate and regular rhythm.     Pulses:          Radial pulses are 2+ on the right side and 2+ on the left side.     Heart sounds: Normal heart sounds, S1 normal and S2 normal. No murmur heard.    Comments: Capillary fill within 2 seconds left fingers. Pulmonary:     Effort: Pulmonary effort is normal.     Breath sounds: Normal breath sounds. No stridor. No wheezing, rhonchi or rales.     Comments: Clear to auscultation bilaterally Musculoskeletal:     Left wrist: Swelling and bony tenderness present. No tenderness or snuff box tenderness. Decreased range of motion.     Left hand: There is no disruption of two-point discrimination. Normal capillary refill.     Comments: Left wrist/hand: Tenderness to palpation with associated swelling noted volar left wrist.  No snuffbox tenderness or focal bony tenderness.  No pain with palpation of metacarpals or phalangeal joints.  Decreased fist formation at approximately 25%.  Neurological:     Mental Status: He is alert.  Psychiatric:        Behavior: Behavior is cooperative.      UC Treatments / Results  Labs (all labs ordered are listed, but only abnormal results are displayed) Labs Reviewed  CBC WITH DIFFERENTIAL/PLATELET  COMPREHENSIVE METABOLIC PANEL WITH GFR   SYPHILIS: RPR W/REFLEX TO RPR TITER AND TREPONEMAL ANTIBODIES, TRADITIONAL SCREENING AND DIAGNOSIS ALGORITHM  HIV ANTIBODY (ROUTINE TESTING W REFLEX)  HEPATITIS C ANTIBODY  TSH  CYTOLOGY, (ORAL, ANAL, URETHRAL) ANCILLARY ONLY    EKG   Radiology DG Wrist Complete Left Result Date: 01/06/2024  CLINICAL DATA:  Pain and swelling EXAM: LEFT WRIST - COMPLETE 3+ VIEW COMPARISON:  None Available. FINDINGS: No fracture or malalignment. Dorsal greater than volar soft tissue swelling at the wrist. Joint spaces are patent. Mineralization within normal limits IMPRESSION: Soft tissue swelling without acute osseous abnormality. Electronically Signed   By: Luke Bun M.D.   On: 01/06/2024 15:10    Procedures Procedures (including critical care time)  Medications Ordered in UC Medications - No data to display  Initial Impression / Assessment and Plan / UC Course  I have reviewed the triage vital signs and the nursing notes.  Pertinent labs & imaging results that were available during my care of the patient were reviewed by me and considered in my medical decision making (see chart for details).     Patient is well-appearing, afebrile, nontoxic, nontachycardic.  We discussed that his symptoms are most likely related to osteoarthritis but he has not yet had a workup so an x-ray was obtained that showed no acute osseous abnormality.  Recommended RICE protocol to help with his pain and he was also given a brace for comfort and support.  He is unable to take NSAIDs due to history of chronic kidney disease based on his last metabolic panel obtained 09/25/2023 with a creatinine of 1.78 so we will start prednisone  taper and we discussed that he should not take NSAIDs with this medication but can use Tylenol  for breakthrough pain.  If his symptoms or not improving with conservative treatment measures I did recommend that he follow-up with hand specialist and he was given the contact information for local  provider with instruction to call to schedule an appointment.  If anything worsens or changes he is to return for reevaluation.  Patient was interested in complete STI panel.  This was obtained and is pending.  Will contact him if any of this is abnormal.  Discussed that he is to abstain from sex until his results are available.  If he has any worsening symptoms he needs to be seen immediately.  Patient was specifically concerned about hepatitis C despite having negative testing in the past.  He does report that he has widespread pruritus though he is unsure if this is related to dry skin or an underlying condition.  He did have slightly elevated transaminases when he was seen in the emergency room in September 2025 (mid 40s) and has not had additional workup.  He was encouraged to several drinks soaps and detergents and use emollients to help manage pruritus but will also obtain basic blood work including repeat CMP to trend transaminases, CBC, TSH.  He does not currently have a primary care so we scheduled him an appointment for March but discussed that if his symptoms persist or worsen he can return here for reevaluation.  Discussed that if he has any worsening or changing symptoms including abdominal pain, weight loss, nausea/vomiting, jaundice or scleral icterus, worsening pruritus he should be seen immediately.  Return precautions given.  Patient expressed understanding and agreement to treatment plan.  Final Clinical Impressions(s) / UC Diagnoses   Final diagnoses:  Left wrist pain  Screening examination for STI  Pruritus     Discharge Instructions      Your x-ray showed arthritis but no evidence of a broken or out of place bone.  Use the brace for comfort and support.  Keep your wrist elevated and apply ice 15 minutes at a time 3-4 times a day.  Start prednisone .  Do not take  NSAIDs with this medication including aspirin, ibuprofen /Advil , naproxen/Aleve.  I will contact you if any of your  blood work is abnormal.  Follow-up with hand specialist if your symptoms are not improving; call to schedule appointment.  If anything worsens please return including increasing pain, fever, numbness or tingling in your hand, weakness.     ED Prescriptions     Medication Sig Dispense Auth. Provider   predniSONE  (STERAPRED UNI-PAK 21 TAB) 10 MG (21) TBPK tablet Take 40 mg (4 tablets) days 1 and 2, take 30 mg (3 tablets) days 3 and 4, take 20 mg (2 tablets) days 5 and 6, take 10 mg (1 tablet) day 7 and 8, take 5 mg (0.5 tablet) day 9 and 10, then stop. 21 tablet Dniya Neuhaus K, PA-C      PDMP not reviewed this encounter.    [1]  Social History Tobacco Use   Smoking status: Every Day    Current packs/day: 0.80    Types: Cigarettes   Smokeless tobacco: Never  Vaping Use   Vaping status: Never Used  Substance Use Topics   Alcohol use: Not Currently    Comment: occasional   Drug use: Not Currently    Types: Methamphetamines, IV    Comment: heroin; crystal meth     Sherrell Rocky POUR, PA-C 01/06/24 1522  "

## 2024-01-06 NOTE — ED Notes (Signed)
 Attempted x 2 to draw blood. Unsuccessful.

## 2024-01-06 NOTE — ED Triage Notes (Signed)
 Patient c/o left wrist pain and swelling x 3 months. Patient denies any injury.  Patient denies taking any medications for his symptoms.

## 2024-01-06 NOTE — Discharge Instructions (Signed)
 Your x-ray showed arthritis but no evidence of a broken or out of place bone.  Use the brace for comfort and support.  Keep your wrist elevated and apply ice 15 minutes at a time 3-4 times a day.  Start prednisone .  Do not take NSAIDs with this medication including aspirin, ibuprofen /Advil , naproxen/Aleve.  I will contact you if any of your blood work is abnormal.  Follow-up with hand specialist if your symptoms are not improving; call to schedule appointment.  If anything worsens please return including increasing pain, fever, numbness or tingling in your hand, weakness.

## 2024-01-06 NOTE — Telephone Encounter (Signed)
 Pharmacy change requested by the patient.

## 2024-01-07 LAB — SYPHILIS: RPR W/REFLEX TO RPR TITER AND TREPONEMAL ANTIBODIES, TRADITIONAL SCREENING AND DIAGNOSIS ALGORITHM: RPR Ser Ql: NONREACTIVE

## 2024-01-09 ENCOUNTER — Ambulatory Visit (HOSPITAL_COMMUNITY): Payer: Self-pay

## 2024-01-09 LAB — CYTOLOGY, (ORAL, ANAL, URETHRAL) ANCILLARY ONLY
Chlamydia: NEGATIVE
Comment: NEGATIVE
Comment: NEGATIVE
Comment: NORMAL
Neisseria Gonorrhea: NEGATIVE
Trichomonas: NEGATIVE

## 2024-01-24 ENCOUNTER — Other Ambulatory Visit: Payer: Self-pay

## 2024-01-24 ENCOUNTER — Encounter (HOSPITAL_COMMUNITY): Payer: Self-pay

## 2024-01-24 ENCOUNTER — Emergency Department (HOSPITAL_COMMUNITY)
Admission: EM | Admit: 2024-01-24 | Discharge: 2024-01-25 | Disposition: A | Discharge status: Home / Self Care | Attending: Emergency Medicine | Admitting: Emergency Medicine

## 2024-01-24 DIAGNOSIS — J4489 Other specified chronic obstructive pulmonary disease: Secondary | ICD-10-CM | POA: Insufficient documentation

## 2024-01-24 DIAGNOSIS — M25532 Pain in left wrist: Secondary | ICD-10-CM | POA: Diagnosis present

## 2024-01-24 DIAGNOSIS — Z859 Personal history of malignant neoplasm, unspecified: Secondary | ICD-10-CM | POA: Insufficient documentation

## 2024-01-24 NOTE — ED Triage Notes (Signed)
 Pateint states that he has been to UC and was put on prednisone  for arthritis, he states that his left hand is still hurting. He states they also did blood work and noticed that he is having some liver and kidney failure.

## 2024-01-25 NOTE — ED Provider Notes (Signed)
" °  [place] [facility] AT [facility] Provider Note   CSN: [id number] Arrival date & time: 01/24/24  2123     Patient presents with: No chief complaint on file.   Andres Harding is a 59 y.o. male.   HPI Patient history of COPD currently unhoused, presents with chronic left wrist pain for months.  No recent trauma or falls.  No fevers or chills. Person also reports she had recent abnormal lab findings He  has a new PCP appointment in March   Past Medical History:  Diagnosis Date   Asthma    Back pain    Bronchitis    Cancer (HCC)    COPD (chronic obstructive pulmonary disease) (HCC)    Emphysema lung (HCC)    Emphysema of lung (HCC)    Emphysema of lung (HCC)     Prior to Admission medications  Medication Sig Start Date End Date Taking? Authorizing Provider  albuterol  (VENTOLIN  HFA) 108 (90 Base) MCG/ACT inhaler Inhale 1-2 puffs into the lungs every 6 (six) hours as needed for wheezing or shortness of breath. 09/25/23   Griselda Norris, MD    Allergies: Patient has no known allergies.    Review of Systems  Updated Vital Signs BP (!) 153/88 (BP Location: Right Arm)   Pulse (!) 103   Temp (!) 97.5 F (36.4 C)   Resp 12   Ht 2.007 m (6' 7)   Wt 81.6 kg   SpO2 100%   BMI 20.28 kg/m   Physical Exam CONSTITUTIONAL: Well developed/well nourished HEAD: Normocephalic/atraumatic NEURO: Pt is awake/alert/appropriate, moves all extremitiesx4.  No facial droop.   EXTREMITIES: pulses normal/equal, full ROM Mild swelling and tenderness noted left wrist.  No deformities.  No erythema, no fluctuance Distal pulses intact SKIN: warm, color normal PSYCH: no abnormalities of mood noted, alert and oriented to situation  (all labs ordered are listed, but only abnormal results are displayed) Labs Reviewed - No data to display  EKG: None  Radiology: No results found.   Procedures   Medications Ordered in the ED - No data to display                                   Medical Decision Making  Patient presents with chronic left wrist pain, will refer to orthopedics. We discussed his recent lab findings and will need to establish a PCP, this is not an acute issue at this time Will offer Velcro wrist splint     Final diagnoses:  Arthralgia of left wrist    ED Discharge Orders     None          Midge Golas, MD 01/25/24 [phone]  "

## 2024-01-25 NOTE — Progress Notes (Signed)
 Orthopedic Tech Progress Note Patient Details:  Andres Harding [birth year] [id number] Applied velcro wrist brace per order.  Ortho Devices Type of Ortho Device: Velcro wrist splint Ortho Device/Splint Location: LUE Ortho Device/Splint Interventions: Ordered, Application, Adjustment   Post Interventions Patient Tolerated: Well Instructions Provided: Adjustment of device, Care of device, Poper ambulation with device  Morna Pink 01/25/2024, 1:37 AM

## 2024-02-02 ENCOUNTER — Other Ambulatory Visit (HOSPITAL_COMMUNITY): Payer: Self-pay

## 2024-03-08 ENCOUNTER — Ambulatory Visit: Admitting: Family Medicine
